# Patient Record
Sex: Female | Born: 1975 | Race: Black or African American | Hispanic: No | Smoking: Current every day smoker
Health system: Southern US, Community
[De-identification: ages and names within clinical notes are randomized; demographics above are authoritative.]

## PROBLEM LIST (undated history)

## (undated) DIAGNOSIS — I10 Essential (primary) hypertension: Secondary | ICD-10-CM

## (undated) DIAGNOSIS — F419 Anxiety disorder, unspecified: Secondary | ICD-10-CM

## (undated) DIAGNOSIS — E119 Type 2 diabetes mellitus without complications: Secondary | ICD-10-CM

## (undated) HISTORY — DX: Anxiety disorder, unspecified: F41.9

## (undated) HISTORY — DX: Essential (primary) hypertension: I10

---

## 2007-02-22 ENCOUNTER — Emergency Department (HOSPITAL_COMMUNITY): Admission: EM | Admit: 2007-02-22 | Discharge: 2007-02-22 | Payer: Self-pay | Admitting: Emergency Medicine

## 2017-05-16 DIAGNOSIS — F419 Anxiety disorder, unspecified: Secondary | ICD-10-CM | POA: Insufficient documentation

## 2017-05-16 DIAGNOSIS — I10 Essential (primary) hypertension: Secondary | ICD-10-CM | POA: Insufficient documentation

## 2017-05-16 DIAGNOSIS — I152 Hypertension secondary to endocrine disorders: Secondary | ICD-10-CM | POA: Insufficient documentation

## 2017-05-21 LAB — HM PAP SMEAR

## 2017-07-01 DIAGNOSIS — E559 Vitamin D deficiency, unspecified: Secondary | ICD-10-CM

## 2017-07-01 HISTORY — DX: Vitamin D deficiency, unspecified: E55.9

## 2019-02-12 ENCOUNTER — Other Ambulatory Visit: Payer: Self-pay

## 2019-02-12 NOTE — Progress Notes (Signed)
This encounter was created in error - please disregard.

## 2019-02-13 ENCOUNTER — Encounter: Payer: Self-pay | Admitting: Family Medicine

## 2019-02-24 ENCOUNTER — Ambulatory Visit: Payer: Managed Care, Other (non HMO) | Admitting: Family Medicine

## 2019-04-15 ENCOUNTER — Other Ambulatory Visit: Payer: Self-pay

## 2019-04-16 ENCOUNTER — Ambulatory Visit (INDEPENDENT_AMBULATORY_CARE_PROVIDER_SITE_OTHER): Payer: Managed Care, Other (non HMO) | Admitting: Family Medicine

## 2019-04-16 ENCOUNTER — Encounter: Payer: Self-pay | Admitting: Family Medicine

## 2019-04-16 VITALS — BP 132/83 | HR 72 | Temp 98.4°F | Ht 66.0 in | Wt 219.0 lb

## 2019-04-16 DIAGNOSIS — F419 Anxiety disorder, unspecified: Secondary | ICD-10-CM | POA: Diagnosis not present

## 2019-04-16 DIAGNOSIS — I10 Essential (primary) hypertension: Secondary | ICD-10-CM

## 2019-04-16 DIAGNOSIS — R7989 Other specified abnormal findings of blood chemistry: Secondary | ICD-10-CM

## 2019-04-16 DIAGNOSIS — N760 Acute vaginitis: Secondary | ICD-10-CM

## 2019-04-16 DIAGNOSIS — N898 Other specified noninflammatory disorders of vagina: Secondary | ICD-10-CM

## 2019-04-16 DIAGNOSIS — B9689 Other specified bacterial agents as the cause of diseases classified elsewhere: Secondary | ICD-10-CM

## 2019-04-16 DIAGNOSIS — Z0000 Encounter for general adult medical examination without abnormal findings: Secondary | ICD-10-CM

## 2019-04-16 DIAGNOSIS — R062 Wheezing: Secondary | ICD-10-CM

## 2019-04-16 DIAGNOSIS — Z130 Encounter for screening for diseases of the blood and blood-forming organs and certain disorders involving the immune mechanism: Secondary | ICD-10-CM

## 2019-04-16 DIAGNOSIS — Z720 Tobacco use: Secondary | ICD-10-CM

## 2019-04-16 DIAGNOSIS — Z23 Encounter for immunization: Secondary | ICD-10-CM | POA: Diagnosis not present

## 2019-04-16 DIAGNOSIS — E559 Vitamin D deficiency, unspecified: Secondary | ICD-10-CM

## 2019-04-16 LAB — WET PREP FOR TRICH, YEAST, CLUE
Clue Cell Exam: POSITIVE — AB
Trichomonas Exam: NEGATIVE
Yeast Exam: NEGATIVE

## 2019-04-16 MED ORDER — ATENOLOL 25 MG PO TABS: 25.0000 mg | ORAL_TABLET | Freq: Every day | ORAL | 2 refills | Status: DC

## 2019-04-16 MED ORDER — ESCITALOPRAM OXALATE 20 MG PO TABS: 20.0000 mg | ORAL_TABLET | Freq: Every day | ORAL | 2 refills | Status: DC

## 2019-04-16 MED ORDER — ALBUTEROL SULFATE HFA 108 (90 BASE) MCG/ACT IN AERS: 2.0000 | INHALATION_SPRAY | Freq: Four times a day (QID) | RESPIRATORY_TRACT | 2 refills | Status: DC | PRN

## 2019-04-16 MED ORDER — METRONIDAZOLE 500 MG PO TABS: 500.0000 mg | ORAL_TABLET | Freq: Two times a day (BID) | ORAL | 0 refills | Status: DC

## 2019-04-16 NOTE — Progress Notes (Signed)
New Patient Office Visit  Assessment & Plan:  1. Well adult exam - Preventive care education provided. Patient declines influenza and HIV screening. UTD with mammogram and pap smear. Mammogram requested from [facility] in [place].  - CBC with Differential/Platelet - CMP14+EGFR - Lipid panel  2. Wheezing - Encouraged smoking cessation. - albuterol (VENTOLIN HFA) 108 (90 Base) MCG/ACT inhaler; Inhale 2 puffs into the lungs every 6 (six) hours as needed for wheezing or shortness of breath.  Dispense: 18 g; Refill: 2  3. Essential (primary) hypertension - Well controlled on current regimen.  - CMP14+EGFR - Lipid panel - atenolol (TENORMIN) 25 MG tablet; Take 1 tablet (25 mg total) by mouth daily.  Dispense: 90 tablet; Refill: 2  4. Anxiety - Well controlled on current regimen.  - escitalopram (LEXAPRO) 20 MG tablet; Take 1 tablet (20 mg total) by mouth daily.  Dispense: 90 tablet; Refill: 2 - CMP14+EGFR  5. Vitamin D deficiency - Not on a supplement.  - VITAMIN D 25 Hydroxy (Vit-D Deficiency, Fractures)  6. Abnormal TSH - Thyroid Panel With TSH  7. Vaginal discharge - WET PREP FOR [place], YEAST, CLUE  8. Vagina itching - WET PREP FOR [place], YEAST, CLUE  9. Tobacco use - Encouraged smoking cessation.  - CBC with Differential/Platelet - Lipid panel  10. Immunization due - Tdap vaccine greater than or equal to 7yo IM  11. Screening for deficiency anemia - CBC with Differential/Platelet   Follow-up: Return in about 1 year (around 04/15/2020) for annual physical.   Hendricks Limes, MSN, APRN, FNP-C [facility]  Subjective:  Patient ID: Rebecca Garner, female    DOB: [birth year]  Age: 44 y.o. MRN: [id number]  Patient Care Team: Loman Brooklyn, FNP as PCP - General (Family Medicine)  CC:  Chief Complaint  Patient presents with  . New Patient (Initial Visit)    HPI Rebecca Garner presents to establish care. She is transferring care from  Dr. Murrell Redden office as he has retired and the office has closed.   Patient reports she wheezes quite often. Denies shortness of breath. She does smoke.  Her other concern is that her eye doctor told her she has cholesterol pockets in her eyes and recommended having cholesterol levels obtained.   She takes Lexapro 20 mg which controls anxiety well.   Atenolol for HTN.   Depression screen [facility] 2/9 04/16/2019  Decreased Interest 0  Down, Depressed, Hopeless 0  PHQ - 2 Score 0  Altered sleeping 1  Tired, decreased energy 0  Change in appetite 0  Feeling bad or failure about yourself  0  Trouble concentrating 0  Moving slowly or fidgety/restless 0  Suicidal thoughts 0  PHQ-9 Score 1  Difficult doing work/chores Not difficult at all   GAD 7 : Generalized Anxiety Score 04/16/2019  Nervous, Anxious, on Edge 0  Control/stop worrying 2  Worry too much - different things 2  Trouble relaxing 0  Restless 0  Easily annoyed or irritable 1  Anxiety Difficulty Somewhat difficult   Patient also c/o vaginal itching and discharge. No concern for STIs.   Review of Systems  Constitutional: Negative for chills, fever, malaise/fatigue and weight loss.  HENT: Negative for congestion, ear discharge, ear pain, nosebleeds, sinus pain, sore throat and tinnitus.   Eyes: Negative for blurred vision, double vision, pain, discharge and redness.  Respiratory: Negative for cough, shortness of breath and wheezing.   Cardiovascular: Negative for chest pain, palpitations and leg swelling.  Gastrointestinal: Negative for abdominal pain, constipation, diarrhea, heartburn, nausea and vomiting.  Genitourinary: Negative for dysuria, frequency and urgency.  Musculoskeletal: Negative for myalgias.  Skin: Negative for rash.  Neurological: Negative for dizziness, seizures, weakness and headaches.  Psychiatric/Behavioral: Negative for depression, substance abuse and suicidal ideas. The patient is not nervous/anxious.      Current Outpatient Medications:  .  atenolol (TENORMIN) 25 MG tablet, Take 1 tablet (25 mg total) by mouth daily., Disp: 90 tablet, Rfl: 2 .  escitalopram (LEXAPRO) 20 MG tablet, Take 1 tablet (20 mg total) by mouth daily., Disp: 90 tablet, Rfl: 2 .  albuterol (VENTOLIN HFA) 108 (90 Base) MCG/ACT inhaler, Inhale 2 puffs into the lungs every 6 (six) hours as needed for wheezing or shortness of breath., Disp: 18 g, Rfl: 2  No Known Allergies  Past Medical History:  Diagnosis Date  . Anxiety   . Hypertension     History reviewed. No pertinent surgical history.  Family History  Problem Relation Age of Onset  . Diabetes Mother   . Hypertension Mother   . Hypertension Sister     Social History   Socioeconomic History  . Marital status: Married    Spouse name: Not on file  . Number of children: Not on file  . Years of education: Not on file  . Highest education level: Not on file  Occupational History  . Not on file  Tobacco Use  . Smoking status: Current Every Day Smoker    Packs/day: 0.50    Years: 15.00    Pack years: 7.50  . Smokeless tobacco: Never Used  Substance and Sexual Activity  . Alcohol use: Never  . Drug use: Never  . Sexual activity: Not Currently  Other Topics Concern  . Not on file  Social History Narrative  . Not on file   Social Determinants of Health   Financial Resource Strain:   . Difficulty of Paying Living Expenses: Not on file  Food Insecurity:   . Worried About Charity fundraiser in the Last Year: Not on file  . Ran Out of Food in the Last Year: Not on file  Transportation Needs:   . Lack of Transportation (Medical): Not on file  . Lack of Transportation (Non-Medical): Not on file  Physical Activity:   . Days of Exercise per Week: Not on file  . Minutes of Exercise per Session: Not on file  Stress:   . Feeling of Stress : Not on file  Social Connections:   . Frequency of Communication with Friends and Family: Not on file  .  Frequency of Social Gatherings with Friends and Family: Not on file  . Attends Religious Services: Not on file  . Active Member of Clubs or Organizations: Not on file  . Attends Archivist Meetings: Not on file  . Marital Status: Not on file  Intimate Partner Violence:   . Fear of Current or Ex-Partner: Not on file  . Emotionally Abused: Not on file  . Physically Abused: Not on file  . Sexually Abused: Not on file    Objective:   Today's Vitals: BP 132/83   Pulse 72   Temp 98.4 F (36.9 C) (Temporal)   Ht 5' 6"  (1.676 m)   Wt 219 lb (99.3 kg)   LMP 03/20/2019 (Exact Date)   SpO2 97%   BMI 35.35 kg/m   Physical Exam Vitals reviewed.  Constitutional:      General: She is not in acute distress.  Appearance: Normal appearance. She is obese. She is not ill-appearing, toxic-appearing or diaphoretic.  HENT:     Head: Normocephalic and atraumatic.     Right Ear: Tympanic membrane, ear canal and external ear normal. There is no impacted cerumen.     Left Ear: Tympanic membrane, ear canal and external ear normal. There is no impacted cerumen.     Nose: Nose normal. No congestion or rhinorrhea.     Mouth/Throat:     Mouth: Mucous membranes are moist.     Pharynx: Oropharynx is clear. No oropharyngeal exudate or posterior oropharyngeal erythema.  Eyes:     General: No scleral icterus.       Right eye: No discharge.        Left eye: No discharge.     Conjunctiva/sclera: Conjunctivae normal.     Pupils: Pupils are equal, round, and reactive to light.  Cardiovascular:     Rate and Rhythm: Normal rate and regular rhythm.     Heart sounds: Normal heart sounds. No murmur. No friction rub. No gallop.   Pulmonary:     Effort: Pulmonary effort is normal. No respiratory distress.     Breath sounds: Normal breath sounds. No stridor. No wheezing, rhonchi or rales.  Abdominal:     General: Abdomen is flat. Bowel sounds are normal. There is no distension.     Palpations: Abdomen  is soft. There is no mass.     Tenderness: There is no abdominal tenderness. There is no guarding or rebound.     Hernia: No hernia is present.  Musculoskeletal:        General: Normal range of motion.     Cervical back: Normal range of motion and neck supple. No rigidity. No muscular tenderness.  Lymphadenopathy:     Cervical: No cervical adenopathy.  Skin:    General: Skin is warm and dry.     Capillary Refill: Capillary refill takes less than 2 seconds.  Neurological:     General: No focal deficit present.     Mental Status: She is alert and oriented to person, place, and time. Mental status is at baseline.  Psychiatric:        Mood and Affect: Mood normal.        Behavior: Behavior normal.        Thought Content: Thought content normal.        Judgment: Judgment normal.

## 2019-04-16 NOTE — Patient Instructions (Signed)

## 2019-04-17 ENCOUNTER — Encounter: Payer: Self-pay | Admitting: Family Medicine

## 2019-04-17 DIAGNOSIS — E1169 Type 2 diabetes mellitus with other specified complication: Secondary | ICD-10-CM | POA: Insufficient documentation

## 2019-04-17 DIAGNOSIS — E782 Mixed hyperlipidemia: Secondary | ICD-10-CM

## 2019-04-17 HISTORY — DX: Mixed hyperlipidemia: E78.2

## 2019-04-17 LAB — CBC WITH DIFFERENTIAL/PLATELET
Basophils Absolute: 0 10*3/uL (ref 0.0–0.2)
Basos: 0 %
EOS (ABSOLUTE): 0.1 10*3/uL (ref 0.0–0.4)
Eos: 1 %
Hematocrit: 43.9 % (ref 34.0–46.6)
Hemoglobin: 15 g/dL (ref 11.1–15.9)
Immature Grans (Abs): 0 10*3/uL (ref 0.0–0.1)
Immature Granulocytes: 0 %
Lymphocytes Absolute: 4.5 10*3/uL — ABNORMAL HIGH (ref 0.7–3.1)
Lymphs: 39 %
MCH: 31.1 pg (ref 26.6–33.0)
MCHC: 34.2 g/dL (ref 31.5–35.7)
MCV: 91 fL (ref 79–97)
Monocytes Absolute: 0.9 10*3/uL (ref 0.1–0.9)
Monocytes: 8 %
Neutrophils Absolute: 6.1 10*3/uL (ref 1.4–7.0)
Neutrophils: 52 %
Platelets: 196 10*3/uL (ref 150–450)
RBC: 4.83 x10E6/uL (ref 3.77–5.28)
RDW: 12.3 % (ref 11.7–15.4)
WBC: 11.5 10*3/uL — ABNORMAL HIGH (ref 3.4–10.8)

## 2019-04-17 LAB — LIPID PANEL
Chol/HDL Ratio: 5.1 ratio — ABNORMAL HIGH (ref 0.0–4.4)
Cholesterol, Total: 209 mg/dL — ABNORMAL HIGH (ref 100–199)
HDL: 41 mg/dL (ref >39)
LDL Chol Calc (NIH): 140 mg/dL — ABNORMAL HIGH (ref 0–99)
Triglycerides: 153 mg/dL — ABNORMAL HIGH (ref 0–149)
VLDL Cholesterol Cal: 28 mg/dL (ref 5–40)

## 2019-04-17 LAB — THYROID PANEL WITH TSH
Free Thyroxine Index: 1.7 (ref 1.2–4.9)
T3 Uptake Ratio: 24 % (ref 24–39)
T4, Total: 7.2 ug/dL (ref 4.5–12.0)
TSH: 0.58 u[IU]/mL (ref 0.450–4.500)

## 2019-04-17 LAB — VITAMIN D 25 HYDROXY (VIT D DEFICIENCY, FRACTURES): Vit D, 25-Hydroxy: 11.3 ng/mL — ABNORMAL LOW (ref 30.0–100.0)

## 2019-04-17 LAB — CMP14+EGFR
ALT: 12 IU/L (ref 0–32)
AST: 12 IU/L (ref 0–40)
Albumin/Globulin Ratio: 1.6 (ref 1.2–2.2)
Albumin: 4.6 g/dL (ref 3.8–4.8)
Alkaline Phosphatase: 84 IU/L (ref 39–117)
BUN/Creatinine Ratio: 16 (ref 9–23)
BUN: 12 mg/dL (ref 6–24)
Bilirubin Total: <0.2 mg/dL (ref 0.0–1.2)
CO2: 24 mmol/L (ref 20–29)
Calcium: 9.3 mg/dL (ref 8.7–10.2)
Chloride: 102 mmol/L (ref 96–106)
Creatinine, Ser: 0.77 mg/dL (ref 0.57–1.00)
GFR calc Af Amer: 108 mL/min/{1.73_m2} (ref >59)
GFR calc non Af Amer: 94 mL/min/{1.73_m2} (ref >59)
Globulin, Total: 2.8 g/dL (ref 1.5–4.5)
Glucose: 86 mg/dL (ref 65–99)
Potassium: 4.1 mmol/L (ref 3.5–5.2)
Sodium: 140 mmol/L (ref 134–144)
Total Protein: 7.4 g/dL (ref 6.0–8.5)

## 2019-04-20 ENCOUNTER — Other Ambulatory Visit: Payer: Self-pay | Admitting: Family Medicine

## 2019-04-20 ENCOUNTER — Encounter: Payer: Self-pay | Admitting: Family Medicine

## 2019-04-20 DIAGNOSIS — E559 Vitamin D deficiency, unspecified: Secondary | ICD-10-CM

## 2019-04-20 MED ORDER — VITAMIN D (ERGOCALCIFEROL) 1.25 MG (50000 UNIT) PO CAPS: 50000.0000 [IU] | ORAL_CAPSULE | ORAL | 2 refills | Status: DC

## 2019-05-11 ENCOUNTER — Encounter: Payer: Self-pay | Admitting: Family Medicine

## 2019-06-28 ENCOUNTER — Encounter: Payer: Self-pay | Admitting: Family Medicine

## 2019-06-29 ENCOUNTER — Encounter: Payer: Self-pay | Admitting: Family Medicine

## 2019-06-29 NOTE — Telephone Encounter (Signed)
Neither of those blood sugar readings is dangerous.  However, since she is feeling badly she should be seen.  Please arrange office visit for her.  Thanks, WS

## 2019-12-17 ENCOUNTER — Encounter: Payer: Self-pay | Admitting: Family Medicine

## 2019-12-17 ENCOUNTER — Ambulatory Visit: Payer: Managed Care, Other (non HMO) | Admitting: Family Medicine

## 2019-12-17 ENCOUNTER — Other Ambulatory Visit: Payer: Self-pay

## 2019-12-17 VITALS — BP 158/91 | HR 88 | Temp 98.0°F | Ht 66.0 in | Wt 220.4 lb

## 2019-12-17 DIAGNOSIS — M25562 Pain in left knee: Secondary | ICD-10-CM

## 2019-12-17 MED ORDER — METHYLPREDNISOLONE ACETATE 80 MG/ML IJ SUSP
80.0000 mg | Freq: Once | INTRAMUSCULAR | Status: AC
  Administered 2019-12-17: 80 mg via INTRAMUSCULAR

## 2019-12-17 MED ORDER — MELOXICAM 7.5 MG PO TABS: 7.5000 mg | ORAL_TABLET | Freq: Every day | ORAL | 0 refills | Status: DC

## 2019-12-17 NOTE — Patient Instructions (Signed)

## 2019-12-17 NOTE — Progress Notes (Signed)
Subjective: CC: left knee pain PCP: Rebecca Fudge, FNP  Rebecca Garner:Rebecca Garner is a 44 y.o. female presenting to clinic today for:  1. Left knee pain Left knee pain 2-3 weeks. Pain is throbbing on the medial side of her knee. Pain waxes and wanes and ranges from 4-8/10. She has tried tylenol and ice with mild improvement. She has tried a knee brace without improvement. She has not tried NSAIDS. The pain is worse at night. She reports that her knee does pop, catch, and feels like it may give out at times. She denies peripheral edema, fever, numbness or tingling in her foot. She denies injury. 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 She was seen at [facility] on 12/[facility] for her knee pain. Xray was normal. She was given prednisone to take, but she stopped taking it as she was not able to sleep. She reports that the pain has gotten worse since then.   Relevant past medical, surgical, family, and social history reviewed and updated as indicated.  Allergies and medications reviewed and updated.  No Known Allergies Past Medical History:  Diagnosis Date  . Anxiety   . Hypertension   . Mixed hyperlipidemia 04/17/2019  . Vitamin D deficiency 07/01/2017    Current Outpatient Medications:  .  albuterol (VENTOLIN HFA) 108 (90 Base) MCG/ACT inhaler, Inhale 2 puffs into the lungs every 6 (six) hours as needed for wheezing or shortness of breath., Disp: 18 g, Rfl: 2 .  atenolol (TENORMIN) 25 MG tablet, Take 1 tablet (25 mg total) by mouth daily., Disp: 90 tablet, Rfl: 2 .  escitalopram (LEXAPRO) 20 MG tablet, Take 1 tablet (20 mg total) by mouth daily., Disp: 90 tablet, Rfl: 2 .  Vitamin D, Ergocalciferol, (DRISDOL) 1.25 MG (50000 UNIT) CAPS capsule, Take 1 capsule (50,000 Units total) by mouth every 7 (seven) days., Disp: 12 capsule, Rfl: 2 Social History   Socioeconomic History  . Marital status: Married    Spouse name: Not on file  . Number of children: Not on file  . Years of education:  Not on file  . Highest education level: Not on file  Occupational History  . Not on file  Tobacco Use  . Smoking status: Current Every Day Smoker    Packs/day: 0.50    Years: 15.00    Pack years: 7.50  . Smokeless tobacco: Never Used  Vaping Use  . Vaping Use: Never used  Substance and Sexual Activity  . Alcohol use: Never  . Drug use: Never  . Sexual activity: Not Currently  Other Topics Concern  . Not on file  Social History Narrative  . Not on file   Social Determinants of Health   Financial Resource Strain:   . Difficulty of Paying Living Expenses: Not on file  Food Insecurity:   . Worried About Programme researcher, broadcasting/film/video in the Last Year: Not on file  . Ran Out of Food in the Last Year: Not on file  Transportation Needs:   . Lack of Transportation (Medical): Not on file  . Lack of Transportation (Non-Medical): Not on file  Physical Activity:   . Days of Exercise per Week: Not on file  . Minutes of Exercise per Session: Not on file  Stress:   . Feeling of Stress : Not on file  Social Connections:   . Frequency of Communication with Friends and Family: Not on file  . Frequency of Social Gatherings with Friends and Family: Not on file  .  Attends Religious Services: Not on file  . Active Member of Clubs or Organizations: Not on file  . Attends Banker Meetings: Not on file  . Marital Status: Not on file  Intimate Partner Violence:   . Fear of Current or Ex-Partner: Not on file  . Emotionally Abused: Not on file  . Physically Abused: Not on file  . Sexually Abused: Not on file   Family History  Problem Relation Age of Onset  . Diabetes Mother   . Hypertension Mother   . Hypertension Sister     Review of Systems  Per HPI.   Objective: Office vital signs reviewed. BP (!) 158/91   Pulse 88   Temp 98 F (36.7 C) (Temporal)   Ht 5\' 6"  (1.676 m)   Wt 220 lb 6 oz (100 kg)   BMI 35.57 kg/m   Physical Examination:  Physical Exam Vitals and nursing  note reviewed.  Constitutional:      Appearance: She is not ill-appearing, toxic-appearing or diaphoretic.  Musculoskeletal:     Left knee: Tenderness present over the medial joint line. Normal patellar mobility.     Right lower leg: No edema.     Left lower leg: No edema.     Comments: Significant tendernesss along medial aspect. Unable to complete further exam due to pain.   Skin:    General: Skin is warm and dry.  Neurological:     General: No focal deficit present.     Mental Status: She is alert and oriented to person, place, and time.  Psychiatric:        Mood and Affect: Mood normal.        Behavior: Behavior normal.      Assessment/ Plan: Rebecca Garner was seen today for knee pain.  Diagnoses and all orders for this visit:  Acute pain of left knee Unable to complete exam due to pain. Recent normal xray since pain began. Concern for ligament or meniscus injury based on history. Referral to ortho. Mobic daily, continue tylenol, heat, brace, ice, rest. Discussed benefits of IM steroid injection since patient did not take PO prednisone. Patient agrees to steroid injection and will try OTC sleep aids to help her sleep for a few days. Return to office for new or worsening symptoms. -     Ambulatory referral to Orthopedic Surgery -     meloxicam (MOBIC) 7.5 MG tablet; Take 1 tablet (7.5 mg total) by mouth daily. -     methylPREDNISolone acetate (DEPO-MEDROL) injection 80 mg  Follow up as needed with PCP.  The above assessment and management plan was discussed with the patient. The patient verbalized understanding of and has agreed to the management plan. Patient is aware to call the clinic if symptoms persist or worsen. Patient is aware when to return to the clinic for a follow-up visit. Patient educated on when it is appropriate to go to the emergency department.   Rebecca Pen, FNP-C [facility] [address] [place], [place] [id number]

## 2019-12-24 ENCOUNTER — Ambulatory Visit (INDEPENDENT_AMBULATORY_CARE_PROVIDER_SITE_OTHER): Payer: Managed Care, Other (non HMO) | Admitting: Orthopaedic Surgery

## 2019-12-24 ENCOUNTER — Other Ambulatory Visit: Payer: Self-pay

## 2019-12-24 ENCOUNTER — Encounter: Payer: Self-pay | Admitting: Orthopaedic Surgery

## 2019-12-24 DIAGNOSIS — M25562 Pain in left knee: Secondary | ICD-10-CM | POA: Insufficient documentation

## 2019-12-24 NOTE — Addendum Note (Signed)
Addended by: Rogers Seeds on: 12/24/2019 03:10 PM   Modules accepted: Orders

## 2019-12-24 NOTE — Progress Notes (Signed)
Office Visit Note   Patient: Rebecca Garner           Date of Birth: 1975/09/13           MRN: [id number] Visit Date: 12/24/2019              Requested by: Gabriel Earing, FNP [address] [place],  [place] [address] PCP: Gwenlyn Fudge, FNP   Assessment & Plan: Visit Diagnoses:  1. Acute pain of left knee     Plan: Patient can continue meloxicam should get a knee sleeve local store.  She can continue some ice in the evening after work.  We will set her up for some physical therapy in [place] where she lives recheck her in 3 weeks if she is having persistent medial pain and catching we may need to consider diagnostic MRI scanning.  Follow-Up Instructions: Return in about 3 weeks (around 01/14/2020).   Orders:  No orders of the defined types were placed in this encounter.  No orders of the defined types were placed in this encounter.     Procedures: No procedures performed   Clinical Data: No additional findings.   Subjective: Chief Complaint  Patient presents with  . 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  She has significant pain primarily medial over the medial joint line near the medial collateral ligament that radiates down toward the pes bursa.  She does not recall any injury no previous surgery she is not had any falls.  She states pain is severe she has trouble sleeping.  Sometimes it feels like it wants to give way but has not locked she took some prednisone it made her have problems sleeping but she did notice some improvement she is taking meloxicam now with some improvement.  She is able to ambulate without cane or crutches.  Plain radiographs 12/08/2019 were negative for acute injury.  No significant degenerative changes noted.  Review of Systems patient does have some mild anxiety hypertension hyperlipidemia vitamin D deficiency all other systems are  negative noncontributory to HPI.   Objective: Vital Signs: Ht 5\' 4"  (1.626 m)   Wt 220 lb (99.8 kg)   BMI 37.76 kg/m   Physical Exam Constitutional:      Appearance: She is well-developed.  HENT:     Head: Normocephalic.     Right Ear: External ear normal.     Left Ear: External ear normal.  Eyes:     Pupils: Pupils are equal, round, and reactive to light.  Neck:     Thyroid: No thyromegaly.     Trachea: No tracheal deviation.  Cardiovascular:     Rate and Rhythm: Normal rate.  Pulmonary:     Effort: Pulmonary effort is normal.  Abdominal:     Palpations: Abdomen is soft.  Skin:    General: Skin is warm and dry.  Neurological:     Mental Status: She is alert and oriented to person, place, and time.  Psychiatric:        Behavior: Behavior normal.     Ortho Exam no sciatic notch tenderness she has tenderness over the joint line medially particularly behind the medial collateral ligament.  Medial collateral takes normal stress ACL PCL Lachman test are all normal.  No pitting edema negative Homan ankle range of motion is normal.  No tenderness over the patellar tendon quad tendon.  No crepitus with knee flexion extension and palpation.  Negative patellar subluxation.  Patient has palpable medial plica but is not terribly tender. Specialty Comments:      No specialty comments available.  Imaging: No results found.          PMFS History: Patient Active Problem List   Diagnosis Date Noted  . Pain in left knee 12/24/2019  . Mixed hyperlipidemia 04/17/2019  . Vitamin D deficiency 07/01/2017  . Anxiety 05/16/2017  . Essential (primary) hypertension 05/16/2017   Past Medical History:  Diagnosis Date  . Anxiety   . Hypertension   . Mixed hyperlipidemia 04/17/2019  . Vitamin D deficiency 07/01/2017    Family History  Problem Relation Age of Onset  . Diabetes Mother   . Hypertension Mother   . Hypertension Sister     No past surgical history on file. Social  History   Occupational History  . Not on file  Tobacco Use  . Smoking status: Current Every Day Smoker    Packs/day: 0.50    Years: 15.00    Pack years: 7.50  . Smokeless tobacco: Never Used  Vaping Use  . Vaping Use: Never used  Substance and Sexual Activity  . Alcohol use: Never  . Drug use: Never  . Sexual activity: Not Currently

## 2019-12-28 ENCOUNTER — Ambulatory Visit: Payer: Managed Care, Other (non HMO) | Admitting: Physical Therapy

## 2019-12-31 ENCOUNTER — Telehealth: Payer: Self-pay | Admitting: Radiology

## 2019-12-31 DIAGNOSIS — M25562 Pain in left knee: Secondary | ICD-10-CM

## 2019-12-31 NOTE — Telephone Encounter (Signed)
Voicemail left for patient to return call to [facility] office and let us know where she would like to have scan performed.

## 2019-12-31 NOTE — Telephone Encounter (Signed)
Patient called and states that her knee is still hurting. She wants to proceed with MRI. Please advise.

## 2019-12-31 NOTE — Telephone Encounter (Signed)
OK thanks. ROV after scan R/O mmtear

## 2020-01-01 ENCOUNTER — Encounter: Payer: Self-pay | Admitting: Physical Therapy

## 2020-01-01 ENCOUNTER — Other Ambulatory Visit: Payer: Self-pay

## 2020-01-01 ENCOUNTER — Ambulatory Visit: Payer: Managed Care, Other (non HMO) | Attending: Orthopaedic Surgery | Admitting: Physical Therapy

## 2020-01-01 DIAGNOSIS — R262 Difficulty in walking, not elsewhere classified: Secondary | ICD-10-CM | POA: Diagnosis present

## 2020-01-01 DIAGNOSIS — M25562 Pain in left knee: Secondary | ICD-10-CM | POA: Insufficient documentation

## 2020-01-01 NOTE — Therapy (Signed)
[facility] [address] [place], [place], [address] Phone: [phone]   Fax:  [phone]  Physical Therapy Evaluation  Patient Details  Name: Rebecca Garner MRN: [id number] Date of Birth: [birth year] Referring Provider (PT): Annell Greening, MD   Encounter Date: 01/01/2020   PT End of Session - 01/01/20 1210    Visit Number 1    Number of Visits 12    Date for PT Re-Evaluation 02/19/20    PT Start Time 0945    PT Stop Time 1028    PT Time Calculation (min) 43 min    Activity Tolerance Patient limited by pain    Behavior During Therapy [facility] for tasks assessed/performed           Past Medical History:  Diagnosis Date  . Anxiety   . Hypertension   . Mixed hyperlipidemia 04/17/2019  . Vitamin D deficiency 07/01/2017    History reviewed. No pertinent surgical history.  There were no vitals filed for this visit.    Subjective Assessment - 01/01/20 1036    Subjective COVID-19 screening performed upon arrival. Patient arrives to phyiscal therapy with an insidious onset of left medial knee pain and tingling to left foot that began about 4 weeks ago. Patient reports pain intensifies at night when she is lying down or when she's walking at work. Patient able to perform all ADLs and home tasks but with pain and increased time to perform. Patient reports pain at worst as 8/10 and pain at best as 5/10. Patient's goals are to decrease pain, sleep longer, improve standing and walking tolerance for home and work activities.    Pertinent History HTN    How long can you stand comfortably? 25 mins    How long can you walk comfortably? 25 mins    Diagnostic tests x-ray: normal results    Patient Stated Goals decrease pain    Currently in Pain? Yes    Pain Score 5     Pain Location Knee    Pain Descriptors / Indicators Sharp    Pain Type Acute pain    Pain Onset More than a month ago    Pain Frequency Constant    Aggravating Factors  laying  sometimes walking    Pain Relieving Factors tylenol, ice    Effect of Pain on Daily Activities "sometimes walking"              [facility] PT Assessment - 01/01/20 0001      Assessment   Medical Diagnosis Acute pain of left knee    Referring Provider (PT) Annell Greening, MD    Onset Date/Surgical Date --   "4 weeks ago" Early October 2021   Next MD Visit "3 weeks" around 01/14/2020    Prior Therapy no      Precautions   Precautions None      Restrictions   Weight Bearing Restrictions No      Balance Screen   Has the patient fallen in the past 6 months No    Has the patient had a decrease in activity level because of a fear of falling?  No    Is the patient reluctant to leave their home because of a fear of falling?  No      Home [employer]ist information centre manager residence      Prior Function   Level of Independence Independent      Sensation   Light Touch Appears Intact      Posture/Postural  Control   Posture Comments standing posture noted with increased R weight bearing with trunk flexed and L knee flexed      ROM / Strength   AROM / PROM / Strength AROM;Strength;PROM      AROM   Overall AROM  Deficits;Due to pain    AROM Assessment Site Knee    Right/Left Knee Left    Left Knee Extension 0    Left Knee Flexion 65      PROM   Overall PROM  Deficits;Due to pain    PROM Assessment Site Knee    Right/Left Knee Left    Left Knee Extension 0    Left Knee Flexion 65      Strength   Overall Strength Deficits    Overall Strength Comments pain    Strength Assessment Site Knee    Right/Left Knee Left    Left Knee Flexion 3-/5    Left Knee Extension 3-/5      Palpation   Palpation comment Very tender to palpation to L knee locally to medial knee and distal quad region. Increased pain and tone to left lateral calf upon palpation      Transfers   Comments slow transtions to sit to stand and for bed mobility      Ambulation/Gait   Gait Pattern Step-through  pattern;Decreased step length - right;Decreased stance time - left;Decreased stride length;Decreased hip/knee flexion - left;Decreased weight shift to left;Left flexed knee in stance;Antalgic;Trunk flexed                      Objective measurements completed on examination: See above findings.       [facility] Adult PT Treatment/Exercise - 01/01/20 0001      Modalities   Modalities Cryotherapy      Cryotherapy   Number Minutes Cryotherapy 10 Minutes    Cryotherapy Location Knee    Type of Cryotherapy Ice pack                  PT Education - 01/01/20 1136    Person(s) Educated Patient    Methods Explanation;Demonstration;Handout    Comprehension Verbalized understanding               PT Long Term Goals - 01/01/20 1248      PT LONG TERM GOAL #1   Title Patient will be independent with HEP    Time 6    Period Weeks    Status New      PT LONG TERM GOAL #2   Title Patient will demonstrate 120+ degrees of left knee flexion AROM to improve functional tasks.    Time 6    Period Weeks    Status New      PT LONG TERM GOAL #3   Title Patient will report ability to perform ADLs, home tasks, and work tasks with L knee pain less than or equal to 4/10.    Time 6    Period Weeks    Status New      PT LONG TERM GOAL #4   Title Patient will demonstrate 4/5 or greater left knee MMT in all planes to improve stability during functional tasks.    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    Time 6    Period Weeks    Status New                  Plan - 01/01/20 1242    Clinical Impression Statement Patient is a 44 year old female who presents to physical therapy with acute right knee pain, decreased L knee ROM, and difficulty walking that began about 4 weeks ago. Patient  very tender to palpation profusely thorughout the knee but more intense at L medial knee joint line. Due to intensity of pain to light touch, PT unable to assess special tests for ligaments. Patient and PT discussed plan of care and HEP to which patient reported understanding. Patient also discussed treatment of deficits and MRI will provide information but will not dictate treatment. Patient reported understanding. Patient would benefit from skilled physical therapy to address deficits and address patient's goals.    Personal Factors and Comorbidities Comorbidity 1    Comorbidities HTN    Examination-Activity Limitations Locomotion Level;Transfers;Sleep;Stand;Squat    Examination-Participation Restrictions Occupation    Stability/Clinical Decision Making Stable/Uncomplicated    Clinical Decision Making Low    Rehab Potential Good    PT Frequency 2x / week    PT Duration 6 weeks    PT Treatment/Interventions ADLs/Self Care Home Management;Cryotherapy;Electrical Stimulation;Iontophoresis 4mg /ml Dexamethasone;Moist Heat;Ultrasound;Gait training;Stair training;Functional mobility training;Therapeutic activities;Therapeutic exercise;Balance training;Neuromuscular re-education;Manual techniques;Passive range of motion;Patient/family education;Taping    PT Next Visit Plan Nustep if able, gentle, pain free ROM and strengthening. modalities as needed for pain relief.    PT Home Exercise Plan see patient education section    Consulted and Agree with Plan of Care Patient           Patient will benefit from skilled therapeutic intervention in order to improve the following deficits and impairments:  Abnormal gait, Decreased activity tolerance, Decreased strength, Decreased range of motion, Difficulty walking, Pain  Visit Diagnosis: Acute pain of left knee - Plan: PT plan of care cert/re-cert  Difficulty in walking, not elsewhere classified - Plan: PT plan of care cert/re-cert     Problem  List Patient Active Problem List   Diagnosis Date Noted  . Pain in left knee 12/24/2019  . Mixed hyperlipidemia 04/17/2019  . Vitamin D deficiency 07/01/2017  . Anxiety 05/16/2017  . Essential (primary) hypertension 05/16/2017    05/18/2017, PT, DPT 01/01/2020, 1:00 PM  [facility] [address] [place], [place], [place] Phone: [phone]   Fax:  [phone]  Name: Flo Berroa MRN: [id number] Date of Birth: 1975/11/08

## 2020-01-04 NOTE — Telephone Encounter (Signed)
Patient called [facility] office. MRI has been scheduled and precerted through them.

## 2020-01-04 NOTE — Addendum Note (Signed)
Addended by: Rogers Seeds on: 01/04/2020 05:08 PM   Modules accepted: Orders

## 2020-01-08 ENCOUNTER — Ambulatory Visit: Payer: Managed Care, Other (non HMO) | Attending: Orthopaedic Surgery | Admitting: Physical Therapy

## 2020-01-11 ENCOUNTER — Encounter: Payer: Managed Care, Other (non HMO) | Admitting: Physical Therapy

## 2020-01-14 ENCOUNTER — Ambulatory Visit: Payer: Managed Care, Other (non HMO) | Admitting: Orthopaedic Surgery

## 2020-01-14 ENCOUNTER — Telehealth: Payer: Self-pay | Admitting: Radiology

## 2020-01-14 ENCOUNTER — Other Ambulatory Visit: Payer: Self-pay

## 2020-01-14 NOTE — Telephone Encounter (Signed)
As I discussed with her needs 6 wks of therapy then can ROV for recheck ucall thanks

## 2020-01-14 NOTE — Telephone Encounter (Signed)
Per Claris Gower in [place] office, patient's MRI was denied. Reasoning was that she has not had six weeks of conservative treatment. PT was ordered and patient has been to one appointment per chart. Do you want patient to continue PT and follow up in a few weeks or would you like to schedule peer to peer? Please advise.

## 2020-01-15 NOTE — Telephone Encounter (Signed)
I have notified Claris Gower in the [place] office.

## 2020-01-18 ENCOUNTER — Telehealth: Payer: Self-pay | Admitting: Family Medicine

## 2020-01-18 NOTE — Telephone Encounter (Signed)
Appointment scheduled.

## 2020-01-18 NOTE — Telephone Encounter (Signed)
Pt was seen in the hospital last Thursday, only stayed for one day and needs an appt made

## 2020-01-19 ENCOUNTER — Encounter: Payer: Self-pay | Admitting: Nurse Practitioner

## 2020-01-19 ENCOUNTER — Ambulatory Visit: Payer: Managed Care, Other (non HMO) | Admitting: Nurse Practitioner

## 2020-01-19 ENCOUNTER — Other Ambulatory Visit: Payer: Self-pay

## 2020-01-19 VITALS — BP 135/90 | HR 67 | Temp 97.9°F | Resp 20 | Ht 64.0 in | Wt 223.0 lb

## 2020-01-19 DIAGNOSIS — M25562 Pain in left knee: Secondary | ICD-10-CM | POA: Diagnosis not present

## 2020-01-19 NOTE — Patient Instructions (Signed)

## 2020-01-19 NOTE — Progress Notes (Signed)
   Subjective:    Patient ID: Rebecca Garner, female    DOB: Aug 07, 1975, 43 y.o.   MRN: [id number]   Chief Complaint: Hospitalization Follow-up   HPI Pt is here today for ER follow-up of left knee pain. She was seen in the office a month ago for the same issue and given mobic and referred to orthopedics. Orthopedic doctor referred her to PT which she says she was unable to do because it was too painful. Left knee has burning sensation. Does report some tingling in the left toes at times. Denies swelling. ER gave her a prescription for pain medication which does help and she is still taking the mobic which is also helping. The ER doctor gave her crutches and told her to keep the leg non-weightbearing. She has a follow-up appointment with orthopedic doctor on Thursday. Pain is sharp and shoots down to her foot. Rates pain at 6/10 currently.   Review of Systems  Constitutional: Negative.   HENT: Negative.   Eyes: Negative.   Respiratory: Negative.   Cardiovascular: Negative.   Gastrointestinal: Negative.   Genitourinary: Negative.   Musculoskeletal: Positive for arthralgias (left knee pain).  Skin: Negative.   Neurological: Negative.   Psychiatric/Behavioral: Negative.        Objective:   Physical Exam Vitals and nursing note reviewed.  Constitutional:      Appearance: Normal appearance.  HENT:     Head: Normocephalic.  Cardiovascular:     Rate and Rhythm: Normal rate and regular rhythm.     Pulses: Normal pulses.     Heart sounds: Normal heart sounds.  Pulmonary:     Effort: Pulmonary effort is normal.  Abdominal:     General: Bowel sounds are normal.     Palpations: Abdomen is soft.  Musculoskeletal:     Cervical back: Normal range of motion.     Right knee: Normal.     Left knee: Tenderness present.     Comments: Elastic brace on left knee. Pain on flexion and extension. Tenderness along medial side of knee on palpaition.  Skin:    General: Skin is warm and dry.    Neurological:     General: No focal deficit present.     Mental Status: She is alert and oriented to person, place, and time.  Psychiatric:        Mood and Affect: Mood normal.        Behavior: Behavior normal.    BP 135/90   Pulse 67   Temp 97.9 F (36.6 C) (Temporal)   Resp 20   Ht 5\' 4"  (1.626 m)   Wt 223 lb (101.2 kg)   SpO2 95%   BMI 38.28 kg/m        Assessment & Plan:  Rebecca Garner in today with chief complaint of Hospitalization Follow-up   1. Acute pain of left knee contiue crutches Continue elastic brace Ice BID Keep appointment with ortho on Thursday Patient refused toradol injection.  Previous visit records and er records reviewed  The above assessment and management plan was discussed with the patient. The patient verbalized understanding of and has agreed to the management plan. Patient is aware to call the clinic if symptoms persist or worsen. Patient is aware when to return to the clinic for a follow-up visit. Patient educated on when it is appropriate to go to the emergency department.   Mary-Margaret Saturday, FNP

## 2020-01-21 ENCOUNTER — Encounter: Payer: Self-pay | Admitting: Orthopaedic Surgery

## 2020-01-21 ENCOUNTER — Other Ambulatory Visit: Payer: Self-pay

## 2020-01-21 ENCOUNTER — Ambulatory Visit (INDEPENDENT_AMBULATORY_CARE_PROVIDER_SITE_OTHER): Payer: Managed Care, Other (non HMO) | Admitting: Orthopaedic Surgery

## 2020-01-21 ENCOUNTER — Ambulatory Visit (INDEPENDENT_AMBULATORY_CARE_PROVIDER_SITE_OTHER): Payer: Managed Care, Other (non HMO)

## 2020-01-21 VITALS — Ht 65.0 in | Wt 225.0 lb

## 2020-01-21 DIAGNOSIS — M25552 Pain in left hip: Secondary | ICD-10-CM | POA: Diagnosis not present

## 2020-01-21 DIAGNOSIS — M25562 Pain in left knee: Secondary | ICD-10-CM

## 2020-01-21 NOTE — Progress Notes (Signed)
Office Visit Note   Patient: Rebecca Garner           Date of Birth: 12/27/1975           MRN: [id number] Visit Date: 01/21/2020              Requested by: Gwenlyn Fudge, FNP [address] [place],  [place] [address] PCP: Gwenlyn Fudge, FNP   Assessment & Plan: Visit Diagnoses: Left knee pain   Plan: Patient rates her pain is severe she is not able to ambulate more than short distance in the exam room without her crutches.  She is now using a knee sleeve.  She has been to her PCP also the emergency room with ongoing problems.  As I have recommended before patient needs an MRI scan of her knee to rule out medial meniscal tear since he is not able to dissipate in normal activities and she is not able to work.  We tried therapy which was unsuccessful she is taken anti-inflammatories.  She is used topical cream, Tylenol 3.  Also follow-up after MRI scan left knee.  Follow-Up Instructions: No follow-ups on file.   Orders:  Orders Placed This Encounter  Procedures  . XR HIP UNILAT W OR W/O PELVIS 2-3 VIEWS LEFT   No orders of the defined types were placed in this encounter.     Procedures: No procedures performed   Clinical Data: No additional findings.   Subjective: Chief Complaint  Patient presents with  . Left Knee - Follow-up, Pain    HPI 44 year old female returns for continued problems with severe right knee pain.  I had sent her to physical therapy and she was seen on 01/01/2020 and states therapy was so painful she was unable to participate and did not go back for the second visit.  She followed up on 01/19/2020 at her PCP and was placed in a knee brace.  She had increased pain difficulty sleeping problems with mobility and was seen in the emergency room at [facility] in [place] and they placed her on crutches.  Patient is taking Tylenol 3 for the pain.  She states that she has not been working pain is been better but she still has great difficulty walking is  barely able to ambulate without the crutches has problems getting out of a car and still having catching in her knee.  We had ordered the MRI scan and it was initially denied since she not had multiple weeks of therapy.  We sent her to therapy and she states the activities of therapy were too painful for her.  Patient states at times with ambulation she has had some pain in her groin.  Review of Systems 14 point systems update unchanged from 12/24/2019 visit.  Positive for hypertension hyperlipidemia vitamin D deficiency as before noted.  All other systems negative other than as mentioned in HPI.   Objective: Vital Signs: Ht 5\' 5"  (1.651 m)   Wt 225 lb (102.1 kg)   BMI 37.44 kg/m   Physical Exam Constitutional:      Appearance: She is well-developed.  HENT:     Head: Normocephalic.     Right Ear: External ear normal.     Left Ear: External ear normal.  Eyes:     Pupils: Pupils are equal, round, and reactive to light.  Neck:     Thyroid: No thyromegaly.     Trachea: No tracheal deviation.  Cardiovascular:     Rate and Rhythm:  Normal rate.  Pulmonary:     Effort: Pulmonary effort is normal.  Abdominal:     Palpations: Abdomen is soft.  Skin:    General: Skin is warm and dry.  Neurological:     Mental Status: She is alert and oriented to person, place, and time.  Psychiatric:        Behavior: Behavior normal.     Ortho Exam patient has 0 to 50 degrees range of motion.  She has extreme pain with palpation medial joint line and reaches and grabs my hand as I palpate the joint line.  Less tenderness laterally.  Some crepitus with patellofemoral pressure and knee extension.  Quad tendon patellar tendon is normal.  Patient has some left groin pain with internal and external rotation left hip logroll.  Negative on the right hip.  Patient has mild effusion left knee no swelling right knee.  Specialty Comments:  No specialty comments available.  Imaging: XR HIP UNILAT W OR W/O PELVIS  2-3 VIEWS LEFT  Result Date: 01/21/2020 AP pelvis AP left hip and frog-leg lateral demonstrates normal hip joint.  Negative for fracture or degenerative changes of the left hip. Impression:: Normal left hip x-rays.    PMFS History: Patient Active Problem List   Diagnosis Date Noted  . Pain in left knee 12/24/2019  . Mixed hyperlipidemia 04/17/2019  . Vitamin D deficiency 07/01/2017  . Anxiety 05/16/2017  . Essential (primary) hypertension 05/16/2017   Past Medical History:  Diagnosis Date  . Anxiety   . Hypertension   . Mixed hyperlipidemia 04/17/2019  . Vitamin D deficiency 07/01/2017    Family History  Problem Relation Age of Onset  . Diabetes Mother   . Hypertension Mother   . Hypertension Sister     No past surgical history on file. Social History   Occupational History  . Not on file  Tobacco Use  . Smoking status: Current Every Day Smoker    Packs/day: 0.50    Years: 15.00    Pack years: 7.50  . Smokeless tobacco: Never Used  Vaping Use  . Vaping Use: Never used  Substance and Sexual Activity  . Alcohol use: Never  . Drug use: Never  . Sexual activity: Not Currently

## 2020-01-25 NOTE — Addendum Note (Signed)
Addended by: Rogers Seeds on: 01/25/2020 10:35 AM   Modules accepted: Orders

## 2020-02-01 ENCOUNTER — Encounter: Payer: Self-pay | Admitting: Nurse Practitioner

## 2020-02-01 ENCOUNTER — Ambulatory Visit (INDEPENDENT_AMBULATORY_CARE_PROVIDER_SITE_OTHER): Payer: Managed Care, Other (non HMO) | Admitting: Nurse Practitioner

## 2020-02-01 ENCOUNTER — Other Ambulatory Visit: Payer: Self-pay

## 2020-02-01 VITALS — BP 137/86 | HR 86 | Temp 97.2°F | Ht 65.0 in | Wt 225.6 lb

## 2020-02-01 DIAGNOSIS — N3281 Overactive bladder: Secondary | ICD-10-CM

## 2020-02-01 DIAGNOSIS — I10 Essential (primary) hypertension: Secondary | ICD-10-CM | POA: Diagnosis not present

## 2020-02-01 DIAGNOSIS — F419 Anxiety disorder, unspecified: Secondary | ICD-10-CM | POA: Diagnosis not present

## 2020-02-01 DIAGNOSIS — E782 Mixed hyperlipidemia: Secondary | ICD-10-CM

## 2020-02-01 LAB — URINALYSIS, MICROSCOPIC ONLY

## 2020-02-01 MED ORDER — HYDROXYZINE HCL 10 MG PO TABS: 10.0000 mg | ORAL_TABLET | Freq: Three times a day (TID) | ORAL | 0 refills | Status: DC | PRN

## 2020-02-01 MED ORDER — TOLTERODINE TARTRATE 2 MG PO TABS: 1.0000 mg | ORAL_TABLET | Freq: Two times a day (BID) | ORAL | 0 refills | Status: DC

## 2020-02-01 NOTE — Assessment & Plan Note (Signed)
Symptoms new for patient in the last few months.  Started patient on Detrol LA milligrams tablet daily.  Provided education to patient with printed handouts given.  Rx sent to pharmacy follow-up in 2-3 weeks.

## 2020-02-01 NOTE — Assessment & Plan Note (Signed)
Well-controlled on atenolol 25 mg tablet daily no changes necessary.  Continue healthy diet and exercise regimen as tolerated.  Rx refill sent to pharmacy. Follow-up in 3 months.

## 2020-02-01 NOTE — Progress Notes (Signed)
Established Patient Office Visit  Subjective:  Patient ID: Rebecca Garner, adult    DOB: 1975/10/06  Age: 44 y.o. MRN: [id number]  CC:  Chief Complaint  Patient presents with  . Medical Management of Chronic Issues    HPI [name] presents for Pt presents for follow up of hypertension. Patient was diagnosed in 05/16/2017 the patient is tolerating the medication well without side effects. Compliance with treatment has been good; including taking medication as directed , maintains a healthy diet and regular exercise regimen , and following up as directed.  Current medication atenolol 25 mg tablet daily.  Mixed hyperlipidemia  Pt presents with hyperlipidemia. Patient was diagnosed in 04/17/2019.  Compliance with treatment has been good.  The patient is compliant with medications, maintains a low cholesterol diet , follows up as directed , and maintains an exercise regimen . The patient denies experiencing any hypercholesterolemia related symptoms.     Anxiety: Patient complains of anxiety disorder.  She has the following symptoms: difficulty concentrating. Onset of symptoms was approximately 3 years ago, gradually worsening since that time. She denies current suicidal and homicidal ideation. Family history significant for no psychiatric illness.Possible organic causes contributing are: none. Risk factors: previous episode of depression Previous treatment includes Lexapro.  She complains of the following side effects from the treatment: none.   Overactive bladder: This is new for patient in the last couple of months.  Patient is reporting that she is unable to make it to the bathroom and having accidents several times a day.  Patient is not reporting any dysuria, pelvic pressure, or signs and symptoms of UTI.    Past Medical History:  Diagnosis Date  . Anxiety   . Hypertension   . Mixed hyperlipidemia 04/17/2019  . Vitamin D deficiency 07/01/2017    History reviewed. No pertinent  surgical history.  Family History  Problem Relation Age of Onset  . Diabetes Mother   . Hypertension Mother   . Hypertension Sister     Social History   Socioeconomic History  . Marital status: Divorced    Spouse name: Not on file  . Number of children: Not on file  . Years of education: Not on file  . Highest education level: Not on file  Occupational History  . Not on file  Tobacco Use  . Smoking status: Current Every Day Smoker    Packs/day: 0.50    Years: 15.00    Pack years: 7.50  . Smokeless tobacco: Never Used  Vaping Use  . Vaping Use: Never used  Substance and Sexual Activity  . Alcohol use: Never  . Drug use: Never  . Sexual activity: Not Currently  Other Topics Concern  . Not on file  Social History Narrative  . Not on file   Social Determinants of Health   Financial Resource Strain:   . Difficulty of Paying Living Expenses: Not on file  Food Insecurity:   . Worried About Programme researcher, broadcasting/film/video in the Last Year: Not on file  . Ran Out of Food in the Last Year: Not on file  Transportation Needs:   . Lack of Transportation (Medical): Not on file  . Lack of Transportation (Non-Medical): Not on file  Physical Activity:   . Days of Exercise per Week: Not on file  . Minutes of Exercise per Session: Not on file  Stress:   . Feeling of Stress : Not on file  Social Connections:   . Frequency of Communication with Friends and  Family: Not on file  . Frequency of Social Gatherings with Friends and Family: Not on file  . Attends Religious Services: Not on file  . Active Member of Clubs or Organizations: Not on file  . Attends Banker Meetings: Not on file  . Marital Status: Not on file  Intimate Partner Violence:   . Fear of Current or Ex-Partner: Not on file  . Emotionally Abused: Not on file  . Physically Abused: Not on file  . Sexually Abused: Not on file    Outpatient Medications Prior to Visit  Medication Sig Dispense Refill  .  acetaminophen-codeine (TYLENOL #3) 300-30 MG tablet Take by mouth.    Marland Kitchen albuterol (VENTOLIN HFA) 108 (90 Base) MCG/ACT inhaler Inhale 2 puffs into the lungs every 6 (six) hours as needed for wheezing or shortness of breath. 18 g 2  . atenolol (TENORMIN) 25 MG tablet Take 1 tablet (25 mg total) by mouth daily. 90 tablet 2  . escitalopram (LEXAPRO) 20 MG tablet Take 1 tablet (20 mg total) by mouth daily. 90 tablet 2  . Vitamin D, Ergocalciferol, (DRISDOL) 1.25 MG (50000 UNIT) CAPS capsule Take 1 capsule (50,000 Units total) by mouth every 7 (seven) days. 12 capsule 2  . meloxicam (MOBIC) 7.5 MG tablet Take 1 tablet (7.5 mg total) by mouth daily. (Patient not taking: Reported on 02/01/2020) 30 tablet 0   No facility-administered medications prior to visit.    No Known Allergies  ROS Review of Systems  Constitutional: Negative for fever.  Genitourinary: Positive for frequency and urgency. Negative for difficulty urinating and dysuria.  Neurological: Negative for dizziness, light-headedness and headaches.  Psychiatric/Behavioral: Negative for self-injury, sleep disturbance and suicidal ideas. The patient is nervous/anxious.   All other systems reviewed and are negative.     Objective:    Physical Exam Vitals reviewed.  Constitutional:      Appearance: Normal appearance. She is obese.  HENT:     Head: Normocephalic.     Mouth/Throat:     Mouth: Mucous membranes are moist.     Pharynx: Oropharynx is clear.  Eyes:     Conjunctiva/sclera: Conjunctivae normal.  Cardiovascular:     Rate and Rhythm: Normal rate and regular rhythm.     Pulses: Normal pulses.     Heart sounds: Normal heart sounds.  Pulmonary:     Effort: Pulmonary effort is normal.     Breath sounds: Normal breath sounds.  Abdominal:     General: Bowel sounds are normal.     Tenderness: There is no right CVA tenderness or left CVA tenderness.  Musculoskeletal:        General: Tenderness present.  Skin:    General:  Skin is warm.  Neurological:     Mental Status: She is alert and oriented to person, place, and time.  Psychiatric:     Comments: Anxiety      BP 137/86   Pulse 86   Temp (!) 97.2 F (36.2 C)   Ht 5\' 5"  (1.651 m)   Wt 225 lb 9.6 oz (102.3 kg)   SpO2 96%   BMI 37.54 kg/m  Wt Readings from Last 3 Encounters:  02/01/20 225 lb 9.6 oz (102.3 kg)  01/21/20 225 lb (102.1 kg)  01/19/20 223 lb (101.2 kg)     Health Maintenance Due  Topic Date Due  . Hepatitis C Screening  Never done  . MAMMOGRAM  Never done    There are no preventive care reminders to display  for this patient.  Lab Results  Component Value Date   TSH 0.580 04/16/2019   Lab Results  Component Value Date   WBC 11.5 (H) 04/16/2019   HGB 15.0 04/16/2019   HCT 43.9 04/16/2019   MCV 91 04/16/2019   PLT 196 04/16/2019   Lab Results  Component Value Date   NA 140 04/16/2019   K 4.1 04/16/2019   CO2 24 04/16/2019   GLUCOSE 86 04/16/2019   BUN 12 04/16/2019   CREATININE 0.77 04/16/2019   BILITOT <0.2 04/16/2019   ALKPHOS 84 04/16/2019   AST 12 04/16/2019   ALT 12 04/16/2019   PROT 7.4 04/16/2019   ALBUMIN 4.6 04/16/2019   CALCIUM 9.3 04/16/2019   Lab Results  Component Value Date   CHOL 209 (H) 04/16/2019   Lab Results  Component Value Date   HDL 41 04/16/2019   Lab Results  Component Value Date   LDLCALC 140 (H) 04/16/2019   Lab Results  Component Value Date   TRIG 153 (H) 04/16/2019   Lab Results  Component Value Date   CHOLHDL 5.1 (H) 04/16/2019      Office Visit from 02/01/2020 in [facility]  PHQ-9 Total Score 6     GAD 7 : Generalized Anxiety Score 02/01/2020 04/16/2019  Nervous, Anxious, on Edge 2 0  Control/stop worrying 0 2  Worry too much - different things 2 2  Trouble relaxing 0 0  Restless 0 0  Easily annoyed or irritable 0 1  Afraid - awful might happen 0 -  Total GAD 7 Score 4 -  Anxiety Difficulty Somewhat difficult Somewhat difficult       Assessment & Plan:  Essential (primary) hypertension Well-controlled on atenolol 25 mg tablet daily no changes necessary.  Continue healthy diet and exercise regimen as tolerated.  Rx refill sent to pharmacy. Follow-up in 3 months.  Mixed hyperlipidemia Well-controlled on current medication regimen no changes necessary.  Provided education to patient with printed handouts given.  Advised patient to continue low-cholesterol diet with exercise regimen. Follow-up in 3 months.  Overactive bladder Symptoms new for patient in the last few months.  Started patient on Detrol LA milligrams tablet daily.  Provided education to patient with printed handouts given.  Rx sent to pharmacy follow-up in 2-3 weeks.  Problem List Items Addressed This Visit      Cardiovascular and Mediastinum   Essential (primary) hypertension    Well-controlled on atenolol 25 mg tablet daily no changes necessary.  Continue healthy diet and exercise regimen as tolerated.  Rx refill sent to pharmacy. Follow-up in 3 months.      Relevant Orders   CBC with Differential   Comprehensive metabolic panel     Genitourinary   Overactive bladder    Symptoms new for patient in the last few months.  Started patient on Detrol LA milligrams tablet daily.  Provided education to patient with printed handouts given.  Rx sent to pharmacy follow-up in 2-3 weeks.      Relevant Medications   tolterodine (DETROL) 2 MG tablet   Other Relevant Orders   Urine Microscopic (Completed)     Other   Anxiety   Relevant Medications   hydrOXYzine (ATARAX/VISTARIL) 10 MG tablet   Mixed hyperlipidemia - Primary    Well-controlled on current medication regimen no changes necessary.  Provided education to patient with printed handouts given.  Advised patient to continue low-cholesterol diet with exercise regimen. Follow-up in 3 months.  Relevant Orders   Lipid Panel      Meds ordered this encounter  Medications  . tolterodine  (DETROL) 2 MG tablet    Sig: Take 0.5 tablets (1 mg total) by mouth 2 (two) times daily.    Dispense:  60 tablet    Refill:  0    Order Specific Question:   Supervising Provider    Answer:   Arville CareETTINGER, JOSHUA A [id number]]  . hydrOXYzine (ATARAX/VISTARIL) 10 MG tablet    Sig: Take 1 tablet (10 mg total) by mouth 3 (three) times daily as needed.    Dispense:  30 tablet    Refill:  0    Order Specific Question:   Supervising Provider    Answer:   Arville CareETTINGER, JOSHUA A [[id number]]    Follow-up: Return in about 3 months (around 05/02/2020).    Daryll Drownnyeje M Aleathea Pugmire, NP

## 2020-02-01 NOTE — Patient Instructions (Signed)
Generalized Anxiety Disorder, Adult Generalized anxiety disorder (GAD) is a mental health disorder. People with this condition constantly worry about everyday events. Unlike normal anxiety, worry related to GAD is not triggered by a specific event. These worries also do not fade or get better with time. GAD interferes with life functions, including relationships, work, and school. GAD can vary from mild to severe. People with severe GAD can have intense waves of anxiety with physical symptoms (panic attacks). What are the causes? The exact cause of GAD is not known. What increases the risk? This condition is more likely to develop in:  Women.  People who have a family history of anxiety disorders.  People who are very shy.  People who experience very stressful life events, such as the death of a loved one.  People who have a very stressful family environment. What are the signs or symptoms? People with GAD often worry excessively about many things in their lives, such as their health and family. They may also be overly concerned about:  Doing well at work.  Being on time.  Natural disasters.  Friendships. Physical symptoms of GAD include:  Fatigue.  Muscle tension or having muscle twitches.  Trembling or feeling shaky.  Being easily startled.  Feeling like your heart is pounding or racing.  Feeling out of breath or like you cannot take a deep breath.  Having trouble falling asleep or staying asleep.  Sweating.  Nausea, diarrhea, or irritable bowel syndrome (IBS).  Headaches.  Trouble concentrating or remembering facts.  Restlessness.  Irritability. How is this diagnosed? Your health care provider can diagnose GAD based on your symptoms and medical history. You will also have a physical exam. The health care provider will ask specific questions about your symptoms, including how severe they are, when they started, and if they come and go. Your health care  provider may ask you about your use of alcohol or drugs, including prescription medicines. Your health care provider may refer you to a mental health specialist for further evaluation. Your health care provider will do a thorough examination and may perform additional tests to rule out other possible causes of your symptoms. To be diagnosed with GAD, a person must have anxiety that:  Is out of his or her control.  Affects several different aspects of his or her life, such as work and relationships.  Causes distress that makes him or her unable to take part in normal activities.  Includes at least three physical symptoms of GAD, such as restlessness, fatigue, trouble concentrating, irritability, muscle tension, or sleep problems. Before your health care provider can confirm a diagnosis of GAD, these symptoms must be present more days than they are not, and they must last for six months or longer. How is this treated? The following therapies are usually used to treat GAD:  Medicine. Antidepressant medicine is usually prescribed for long-term daily control. Antianxiety medicines may be added in severe cases, especially when panic attacks occur.  Talk therapy (psychotherapy). Certain types of talk therapy can be helpful in treating GAD by providing support, education, and guidance. Options include: ? Cognitive behavioral therapy (CBT). People learn coping skills and techniques to ease their anxiety. They learn to identify unrealistic or negative thoughts and behaviors and to replace them with positive ones. ? Acceptance and commitment therapy (ACT). This treatment teaches people how to be mindful as a way to cope with unwanted thoughts and feelings. ? Biofeedback. This process trains you to manage your body's response (  physiological response) through breathing techniques and relaxation methods. You will work with a therapist while machines are used to monitor your physical symptoms.  Stress  management techniques. These include yoga, meditation, and exercise. A mental health specialist can help determine which treatment is best for you. Some people see improvement with one type of therapy. However, other people require a combination of therapies. Follow these instructions at home:  Take over-the-counter and prescription medicines only as told by your health care provider.  Try to maintain a normal routine.  Try to anticipate stressful situations and allow extra time to manage them.  Practice any stress management or self-calming techniques as taught by your health care provider.  Do not punish yourself for setbacks or for not making progress.  Try to recognize your accomplishments, even if they are small.  Keep all follow-up visits as told by your health care provider. This is important. Contact a health care provider if:  Your symptoms do not get better.  Your symptoms get worse.  You have signs of depression, such as: ? A persistently sad, cranky, or irritable mood. ? Loss of enjoyment in activities that used to bring you joy. ? Change in weight or eating. ? Changes in sleeping habits. ? Avoiding friends or family members. ? Loss of energy for normal tasks. ? Feelings of guilt or worthlessness. Get help right away if:  You have serious thoughts about hurting yourself or others. If you ever feel like you may hurt yourself or others, or have thoughts about taking your own life, get help right away. You can go to your nearest emergency department or call:  Your local emergency services (911 in the [place]  A suicide crisis helpline, such as the National Suicide Prevention Lifeline at [phone])218-1381. This is open 24 hours a day. Summary  Generalized anxiety disorder (GAD) is a mental health disorder that involves worry that is not triggered by a specific event.  People with GAD often worry excessively about many things in their lives, such as their health and  family.  GAD may cause physical symptoms such as restlessness, trouble concentrating, sleep problems, frequent sweating, nausea, diarrhea, headaches, and trembling or muscle twitching.  A mental health specialist can help determine which treatment is best for you. Some people see improvement with one type of therapy. However, other people require a combination of therapies. This information is not intended to replace advice given to you by your health care provider. Make sure you discuss any questions you have with your health care provider. Document Revised: 02/01/2017 Document Reviewed: 01/10/2016 Elsevier Patient Education  2020 [employer] High Cholesterol  High cholesterol is a condition in which the blood has high levels of a white, waxy, fat-like substance (cholesterol). The human body needs small amounts of cholesterol. The liver makes all the cholesterol that the body needs. Extra (excess) cholesterol comes from the food that we eat. Cholesterol is carried from the liver by the blood through the blood vessels. If you have high cholesterol, deposits (plaques) may build up on the walls of your blood vessels (arteries). Plaques make the arteries narrower and stiffer. Cholesterol plaques increase your risk for heart attack and stroke. Work with your health care provider to keep your cholesterol levels in a healthy range. What increases the risk? This condition is more likely to develop in people who:  Eat foods that are high in animal fat (saturated fat) or cholesterol.  Are overweight.  Are not getting enough exercise.  Have a  family history of high cholesterol. What are the signs or symptoms? There are no symptoms of this condition. How is this diagnosed? This condition may be diagnosed from the results of a blood test.  If you are older than age 72, your health care provider may check your cholesterol every 4-6 years.  You may be checked more often if you already have high  cholesterol or other risk factors for heart disease. The blood test for cholesterol measures:  "Bad" cholesterol (LDL cholesterol). This is the main type of cholesterol that causes heart disease. The desired level for LDL is less than 100.  "Good" cholesterol (HDL cholesterol). This type helps to protect against heart disease by cleaning the arteries and carrying the LDL away. The desired level for HDL is 60 or higher.  Triglycerides. These are fats that the body can store or burn for energy. The desired number for triglycerides is lower than 150.  Total cholesterol. This is a measure of the total amount of cholesterol in your blood, including LDL cholesterol, HDL cholesterol, and triglycerides. A healthy number is less than 200. How is this treated? This condition is treated with diet changes, lifestyle changes, and medicines. Diet changes  This may include eating more whole grains, fruits, vegetables, nuts, and fish.  This may also include cutting back on red meat and foods that have a lot of added sugar. Lifestyle changes  Changes may include getting at least 40 minutes of aerobic exercise 3 times a week. Aerobic exercises include walking, biking, and swimming. Aerobic exercise along with a healthy diet can help you maintain a healthy weight.  Changes may also include quitting smoking. Medicines  Medicines are usually given if diet and lifestyle changes have failed to reduce your cholesterol to healthy levels.  Your health care provider may prescribe a statin medicine. Statin medicines have been shown to reduce cholesterol, which can reduce the risk of heart disease. Follow these instructions at home: Eating and drinking If told by your health care provider:  Eat chicken (without skin), fish, veal, shellfish, ground [place] breast, and round or loin cuts of red meat.  Do not eat fried foods or fatty meats, such as hot dogs and salami.  Eat plenty of fruits, such as apples.  Eat  plenty of vegetables, such as broccoli, potatoes, and carrots.  Eat beans, peas, and lentils.  Eat grains such as barley, rice, couscous, and bulgur wheat.  Eat pasta without cream sauces.  Use skim or nonfat milk, and eat low-fat or nonfat yogurt and cheeses.  Do not eat or drink whole milk, cream, ice cream, egg yolks, or hard cheeses.  Do not eat stick margarine or tub margarines that contain trans fats (also called partially hydrogenated oils).  Do not eat saturated tropical oils, such as coconut oil and palm oil.  Do not eat cakes, cookies, crackers, or other baked goods that contain trans fats.  General instructions  Exercise as directed by your health care provider. Increase your activity level with activities such as gardening, walking, and taking the stairs.  Take over-the-counter and prescription medicines only as told by your health care provider.  Do not use any products that contain nicotine or tobacco, such as cigarettes and e-cigarettes. If you need help quitting, ask your health care provider.  Keep all follow-up visits as told by your health care provider. This is important. Contact a health care provider if:  You are struggling to maintain a healthy diet or weight.  You need  help to start on an exercise program.  You need help to stop smoking. Get help right away if:  You have chest pain.  You have trouble breathing. This information is not intended to replace advice given to you by your health care provider. Make sure you discuss any questions you have with your health care provider. Document Revised: 02/22/2017 Document Reviewed: 08/20/2015 Elsevier Patient Education  2020 [employer]. Hypertension, Adult Hypertension is another name for high blood pressure. High blood pressure forces your heart to work harder to pump blood. This can cause problems over time. There are two numbers in a blood pressure reading. There is a top number (systolic) over a  bottom number (diastolic). It is best to have a blood pressure that is below 120/80. Healthy choices can help lower your blood pressure, or you may need medicine to help lower it. What are the causes? The cause of this condition is not known. Some conditions may be related to high blood pressure. What increases the risk?  Smoking.  Having type 2 diabetes mellitus, high cholesterol, or both.  Not getting enough exercise or physical activity.  Being overweight.  Having too much fat, sugar, calories, or salt (sodium) in your diet.  Drinking too much alcohol.  Having long-term (chronic) kidney disease.  Having a family history of high blood pressure.  Age. Risk increases with age.  Race. You may be at higher risk if you are African American.  Gender. Men are at higher risk than women before age 57. After age 71, women are at higher risk than men.  Having obstructive sleep apnea.  Stress. What are the signs or symptoms?  High blood pressure may not cause symptoms. Very high blood pressure (hypertensive crisis) may cause: ? Headache. ? Feelings of worry or nervousness (anxiety). ? Shortness of breath. ? Nosebleed. ? A feeling of being sick to your stomach (nausea). ? Throwing up (vomiting). ? Changes in how you see. ? Very bad chest pain. ? Seizures. How is this treated?  This condition is treated by making healthy lifestyle changes, such as: ? Eating healthy foods. ? Exercising more. ? Drinking less alcohol.  Your health care provider may prescribe medicine if lifestyle changes are not enough to get your blood pressure under control, and if: ? Your top number is above 130. ? Your bottom number is above 80.  Your personal target blood pressure may vary. Follow these instructions at home: Eating and drinking   If told, follow the DASH eating plan. To follow this plan: ? Fill one half of your plate at each meal with fruits and vegetables. ? Fill one fourth of your  plate at each meal with whole grains. Whole grains include whole-wheat pasta, brown rice, and whole-grain bread. ? Eat or drink low-fat dairy products, such as skim milk or low-fat yogurt. ? Fill one fourth of your plate at each meal with low-fat (lean) proteins. Low-fat proteins include fish, chicken without skin, eggs, beans, and tofu. ? Avoid fatty meat, cured and processed meat, or chicken with skin. ? Avoid pre-made or processed food.  Eat less than 1,500 mg of salt each day.  Do not drink alcohol if: ? Your doctor tells you not to drink. ? You are pregnant, may be pregnant, or are planning to become pregnant.  If you drink alcohol: ? Limit how much you use to:  0-1 drink a day for women.  0-2 drinks a day for men. ? Be aware of how much alcohol  is in your drink. In the [place] one drink equals one 12 oz bottle of beer (355 mL), one 5 oz glass of wine (148 mL), or one 1 oz glass of hard liquor (44 mL). Lifestyle   Work with your doctor to stay at a healthy weight or to lose weight. Ask your doctor what the best weight is for you.  Get at least 30 minutes of exercise most days of the week. This may include walking, swimming, or biking.  Get at least 30 minutes of exercise that strengthens your muscles (resistance exercise) at least 3 days a week. This may include lifting weights or doing Pilates.  Do not use any products that contain nicotine or tobacco, such as cigarettes, e-cigarettes, and chewing tobacco. If you need help quitting, ask your doctor.  Check your blood pressure at home as told by your doctor.  Keep all follow-up visits as told by your doctor. This is important. Medicines  Take over-the-counter and prescription medicines only as told by your doctor. Follow directions carefully.  Do not skip doses of blood pressure medicine. The medicine does not work as well if you skip doses. Skipping doses also puts you at risk for problems.  Ask your doctor about side  effects or reactions to medicines that you should watch for. Contact a doctor if you:  Think you are having a reaction to the medicine you are taking.  Have headaches that keep coming back (recurring).  Feel dizzy.  Have swelling in your ankles.  Have trouble with your vision. Get help right away if you:  Get a very bad headache.  Start to feel mixed up (confused).  Feel weak or numb.  Feel faint.  Have very bad pain in your: ? Chest. ? Belly (abdomen).  Throw up more than once.  Have trouble breathing. Summary  Hypertension is another name for high blood pressure.  High blood pressure forces your heart to work harder to pump blood.  For most people, a normal blood pressure is less than 120/80.  Making healthy choices can help lower blood pressure. If your blood pressure does not get lower with healthy choices, you may need to take medicine. This information is not intended to replace advice given to you by your health care provider. Make sure you discuss any questions you have with your health care provider. Document Revised: 10/30/2017 Document Reviewed: 10/30/2017 Elsevier Patient Education  2020 [employer].

## 2020-02-01 NOTE — Assessment & Plan Note (Signed)
Well-controlled on current medication regimen no changes necessary.  Provided education to patient with printed handouts given.  Advised patient to continue low-cholesterol diet with exercise regimen. Follow-up in 3 months.

## 2020-02-02 LAB — COMPREHENSIVE METABOLIC PANEL
ALT: 23 IU/L (ref 0–32)
AST: 13 IU/L (ref 0–40)
Albumin/Globulin Ratio: 1.5 (ref 1.2–2.2)
Albumin: 4.2 g/dL (ref 3.8–4.8)
Alkaline Phosphatase: 85 IU/L (ref 44–121)
BUN/Creatinine Ratio: 17 (ref 9–23)
BUN: 14 mg/dL (ref 6–24)
Bilirubin Total: 0.2 mg/dL (ref 0.0–1.2)
CO2: 25 mmol/L (ref 20–29)
Calcium: 9 mg/dL (ref 8.7–10.2)
Chloride: 103 mmol/L (ref 96–106)
Creatinine, Ser: 0.83 mg/dL (ref 0.57–1.00)
GFR calc Af Amer: 98 mL/min/{1.73_m2} (ref >59)
GFR calc non Af Amer: 85 mL/min/{1.73_m2} (ref >59)
Globulin, Total: 2.8 g/dL (ref 1.5–4.5)
Glucose: 133 mg/dL — ABNORMAL HIGH (ref 65–99)
Potassium: 3.9 mmol/L (ref 3.5–5.2)
Sodium: 140 mmol/L (ref 134–144)
Total Protein: 7 g/dL (ref 6.0–8.5)

## 2020-02-02 LAB — CBC WITH DIFFERENTIAL/PLATELET
Basophils Absolute: 0 10*3/uL (ref 0.0–0.2)
Basos: 0 %
EOS (ABSOLUTE): 0.1 10*3/uL (ref 0.0–0.4)
Eos: 1 %
Hematocrit: 42 % (ref 34.0–46.6)
Hemoglobin: 13.9 g/dL (ref 11.1–15.9)
Immature Grans (Abs): 0 10*3/uL (ref 0.0–0.1)
Immature Granulocytes: 0 %
Lymphocytes Absolute: 3.1 10*3/uL (ref 0.7–3.1)
Lymphs: 34 %
MCH: 30.4 pg (ref 26.6–33.0)
MCHC: 33.1 g/dL (ref 31.5–35.7)
MCV: 92 fL (ref 79–97)
Monocytes Absolute: 0.6 10*3/uL (ref 0.1–0.9)
Monocytes: 6 %
Neutrophils Absolute: 5.5 10*3/uL (ref 1.4–7.0)
Neutrophils: 59 %
Platelets: 220 10*3/uL (ref 150–450)
RBC: 4.57 x10E6/uL (ref 3.77–5.28)
RDW: 12.6 % (ref 11.7–15.4)
WBC: 9.3 10*3/uL (ref 3.4–10.8)

## 2020-02-02 LAB — LIPID PANEL
Chol/HDL Ratio: 5.6 ratio — ABNORMAL HIGH (ref 0.0–4.4)
Cholesterol, Total: 223 mg/dL — ABNORMAL HIGH (ref 100–199)
HDL: 40 mg/dL (ref >39)
LDL Chol Calc (NIH): 160 mg/dL — ABNORMAL HIGH (ref 0–99)
Triglycerides: 129 mg/dL (ref 0–149)
VLDL Cholesterol Cal: 23 mg/dL (ref 5–40)

## 2020-02-04 ENCOUNTER — Telehealth: Payer: Self-pay | Admitting: Family Medicine

## 2020-02-04 ENCOUNTER — Other Ambulatory Visit: Payer: Self-pay | Admitting: Nurse Practitioner

## 2020-02-04 MED ORDER — ATORVASTATIN CALCIUM 10 MG PO TABS: 10.0000 mg | ORAL_TABLET | Freq: Every day | ORAL | 0 refills | Status: DC

## 2020-02-04 NOTE — Telephone Encounter (Signed)
Pt was told that she would be prescribed cholesterol medicine but nothing has been called in

## 2020-02-04 NOTE — Telephone Encounter (Signed)
Patient aware that medication was sent today and labs reviewed again with patient.

## 2020-02-09 ENCOUNTER — Telehealth: Payer: Self-pay | Admitting: Radiology

## 2020-02-09 NOTE — Telephone Encounter (Signed)
Patient would like to know if she can return to work on 02/15/2020?  CB [phone]

## 2020-02-09 NOTE — Telephone Encounter (Signed)
Ok to do thanks. 

## 2020-02-10 NOTE — Telephone Encounter (Signed)
I called patient to advise, no answer, no voicemail set up. Note entered into system.

## 2020-02-11 NOTE — Telephone Encounter (Signed)
Patient called [facility] office. Note was faxed for her.

## 2020-03-21 ENCOUNTER — Other Ambulatory Visit: Payer: Managed Care, Other (non HMO)

## 2020-03-23 ENCOUNTER — Other Ambulatory Visit: Payer: Managed Care, Other (non HMO)

## 2020-04-11 ENCOUNTER — Other Ambulatory Visit: Payer: Managed Care, Other (non HMO)

## 2020-04-11 DIAGNOSIS — Z20822 Contact with and (suspected) exposure to covid-19: Secondary | ICD-10-CM

## 2020-04-12 LAB — NOVEL CORONAVIRUS, NAA: SARS-CoV-2, NAA: NOT DETECTED

## 2020-04-12 LAB — SARS-COV-2, NAA 2 DAY TAT

## 2020-04-29 ENCOUNTER — Other Ambulatory Visit: Payer: Self-pay

## 2020-04-29 ENCOUNTER — Ambulatory Visit (INDEPENDENT_AMBULATORY_CARE_PROVIDER_SITE_OTHER): Payer: Managed Care, Other (non HMO) | Admitting: Family Medicine

## 2020-04-29 ENCOUNTER — Encounter: Payer: Self-pay | Admitting: Family Medicine

## 2020-04-29 VITALS — BP 164/99 | HR 92 | Temp 97.9°F | Ht 65.0 in | Wt 226.4 lb

## 2020-04-29 DIAGNOSIS — R062 Wheezing: Secondary | ICD-10-CM

## 2020-04-29 DIAGNOSIS — R7301 Impaired fasting glucose: Secondary | ICD-10-CM

## 2020-04-29 DIAGNOSIS — I10 Essential (primary) hypertension: Secondary | ICD-10-CM

## 2020-04-29 DIAGNOSIS — N3281 Overactive bladder: Secondary | ICD-10-CM | POA: Diagnosis not present

## 2020-04-29 DIAGNOSIS — F419 Anxiety disorder, unspecified: Secondary | ICD-10-CM

## 2020-04-29 DIAGNOSIS — E782 Mixed hyperlipidemia: Secondary | ICD-10-CM | POA: Diagnosis not present

## 2020-04-29 DIAGNOSIS — E559 Vitamin D deficiency, unspecified: Secondary | ICD-10-CM

## 2020-04-29 DIAGNOSIS — M25562 Pain in left knee: Secondary | ICD-10-CM

## 2020-04-29 DIAGNOSIS — J22 Unspecified acute lower respiratory infection: Secondary | ICD-10-CM

## 2020-04-29 LAB — BAYER DCA HB A1C WAIVED: HB A1C (BAYER DCA - WAIVED): 6.2 % (ref <7.0)

## 2020-04-29 MED ORDER — ATENOLOL 50 MG PO TABS: 50.0000 mg | ORAL_TABLET | Freq: Every day | ORAL | 2 refills | Status: DC

## 2020-04-29 MED ORDER — ESCITALOPRAM OXALATE 20 MG PO TABS: 20.0000 mg | ORAL_TABLET | Freq: Every day | ORAL | 1 refills | Status: DC

## 2020-04-29 MED ORDER — AMOXICILLIN-POT CLAVULANATE 875-125 MG PO TABS
1.0000 | ORAL_TABLET | Freq: Two times a day (BID) | ORAL | 0 refills | Status: AC
Start: 2020-04-29 — End: 2020-05-06

## 2020-04-29 MED ORDER — VITAMIN D (ERGOCALCIFEROL) 1.25 MG (50000 UNIT) PO CAPS: 50000.0000 [IU] | ORAL_CAPSULE | ORAL | 2 refills | Status: DC

## 2020-04-29 MED ORDER — HYDROXYZINE HCL 10 MG PO TABS: 10.0000 mg | ORAL_TABLET | Freq: Three times a day (TID) | ORAL | 2 refills | Status: DC | PRN

## 2020-04-29 MED ORDER — MELOXICAM 7.5 MG PO TABS: 7.5000 mg | ORAL_TABLET | Freq: Every day | ORAL | 5 refills | Status: DC

## 2020-04-29 MED ORDER — ALBUTEROL SULFATE HFA 108 (90 BASE) MCG/ACT IN AERS: 2.0000 | INHALATION_SPRAY | Freq: Four times a day (QID) | RESPIRATORY_TRACT | 5 refills | Status: AC | PRN

## 2020-04-29 MED ORDER — ATORVASTATIN CALCIUM 10 MG PO TABS: 10.0000 mg | ORAL_TABLET | Freq: Every day | ORAL | 1 refills | Status: DC

## 2020-04-29 MED ORDER — METHYLPREDNISOLONE 4 MG PO TBPK: ORAL_TABLET | ORAL | 0 refills | Status: DC

## 2020-04-29 MED ORDER — TOLTERODINE TARTRATE 2 MG PO TABS: 1.0000 mg | ORAL_TABLET | Freq: Two times a day (BID) | ORAL | 1 refills | Status: DC

## 2020-04-29 NOTE — Progress Notes (Signed)
Assessment & Plan:  1. Essential (primary) hypertension Uncontrolled.  Atenolol increased from 25 mg to 50 mg once daily.  Education provided on the DASH diet.  Encouraged diet and exercise. - atenolol (TENORMIN) 50 MG tablet; Take 1 tablet (50 mg total) by mouth daily.  Dispense: 30 tablet; Refill: 2 - CMP14+EGFR - Lipid panel  2. Mixed hyperlipidemia Uncontrolled.  Labs to assess today.  Patient was started on atorvastatin 3 months ago. - atorvastatin (LIPITOR) 10 MG tablet; Take 1 tablet (10 mg total) by mouth daily.  Dispense: 90 tablet; Refill: 1 - CMP14+EGFR - Lipid panel  3. Elevated fasting glucose Labs to assess. - Bayer DCA Hb A1c Waived  4. Overactive bladder Well controlled on current regimen.  - tolterodine (DETROL) 2 MG tablet; Take 0.5 tablets (1 mg total) by mouth 2 (two) times daily.  Dispense: 90 tablet; Refill: 1  5. Vitamin D deficiency Labs to assess for improvement. - Vitamin D, Ergocalciferol, (DRISDOL) 1.25 MG (50000 UNIT) CAPS capsule; Take 1 capsule (50,000 Units total) by mouth every 7 (seven) days.  Dispense: 12 capsule; Refill: 2 - VITAMIN D 25 Hydroxy (Vit-D Deficiency, Fractures)  6. Anxiety Well controlled on current regimen.  - hydrOXYzine (ATARAX/VISTARIL) 10 MG tablet; Take 1 tablet (10 mg total) by mouth 3 (three) times daily as needed.  Dispense: 30 tablet; Refill: 2 - escitalopram (LEXAPRO) 20 MG tablet; Take 1 tablet (20 mg total) by mouth daily.  Dispense: 90 tablet; Refill: 1  7. Wheezing - albuterol (VENTOLIN HFA) 108 (90 Base) MCG/ACT inhaler; Inhale 2 puffs into the lungs every 6 (six) hours as needed for wheezing or shortness of breath.  Dispense: 18 g; Refill: 5  8. Lower respiratory infection - amoxicillin-clavulanate (AUGMENTIN) 875-125 MG tablet; Take 1 tablet by mouth 2 (two) times daily for 7 days.  Dispense: 14 tablet; Refill: 0 - methylPREDNISolone (MEDROL DOSEPAK) 4 MG TBPK tablet; Use as directed.  Dispense: 21 each;  Refill: 0   Return in about 6 weeks (around 06/10/2020) for HTN & pap.  Hendricks Limes, MSN, APRN, FNP-C [facility]  Subjective:    Patient ID: Rebecca Garner, adult    DOB: 20-Oct-1975, 45 y.o.   MRN: [id number]  Patient Care Team: Loman Brooklyn, FNP as PCP - General (Family Medicine)   Chief Complaint:  Chief Complaint  Patient presents with  . Hyperlipidemia  . Anxiety       . overactive bladder     Check up of chronic medical conditions   . Cough    Patient states that she has been having cough and congestion x 2 weeks    HPI: Rebecca Garner is a 45 y.o. adult presenting on 04/29/2020 for Hyperlipidemia, Anxiety (/), overactive bladder  (Check up of chronic medical conditions ), and Cough (Patient states that she has been having cough and congestion x 2 weeks)  Hypertension: Patient reports her blood pressure readings are elevated at home as well.  She does eat a low-salt diet.  She is not doing any exercise.  Hyperlipidemia: Patient is taking medication as prescribed.  Overactive bladder: Patient was started on tolterodine which has been effective for her.  Vitamin D deficiency: Patient reports she has been out of her vitamin D supplement for a couple of weeks.  Anxiety: Controlled with Lexapro.  GAD 7 : Generalized Anxiety Score 02/01/2020 04/16/2019  Nervous, Anxious, on Edge 2 0  Control/stop worrying 0 2  Worry too much - different things 2 2  Trouble relaxing 0 0  Restless 0 0  Easily annoyed or irritable 0 1  Afraid - awful might happen 0 -  Total GAD 7 Score 4 -  Anxiety Difficulty Somewhat difficult Somewhat difficult   Depression screen [facility] 2/9 02/01/2020 02/01/2020 01/19/2020  Decreased Interest 1 0 0  Down, Depressed, Hopeless 1 0 0  PHQ - 2 Score 2 0 0  Altered sleeping 1 - -  Tired, decreased energy 1 - -  Change in appetite 1 - -  Feeling bad or failure about yourself  - - -  Trouble concentrating - - -  Moving slowly or  fidgety/restless 1 - -  Suicidal thoughts - - -  PHQ-9 Score 6 - -  Difficult doing work/chores Somewhat difficult - -    New complaints: Patient complains of cough, chest congestion, wheezing and nausea. Onset of symptoms was 2 weeks ago, unchanged since that time. She is drinking plenty of fluids. Evaluation to date: at home COVID-19 test negative.. Treatment to date: none. She does smoke. Patient has not been fully vaccinated against COVID-19, but has received the first two doses.   Social history:  Relevant past medical, surgical, family and social history reviewed and updated as indicated. Interim medical history since our last visit reviewed.  Allergies and medications reviewed and updated.  DATA REVIEWED: CHART IN EPIC  ROS: Negative unless specifically indicated above in HPI.    Current Outpatient Medications:  .  acetaminophen-codeine (TYLENOL #3) 300-30 MG tablet, Take by mouth., Disp: , Rfl:  .  albuterol (VENTOLIN HFA) 108 (90 Base) MCG/ACT inhaler, Inhale 2 puffs into the lungs every 6 (six) hours as needed for wheezing or shortness of breath., Disp: 18 g, Rfl: 2 .  atenolol (TENORMIN) 25 MG tablet, Take 1 tablet (25 mg total) by mouth daily., Disp: 90 tablet, Rfl: 2 .  atorvastatin (LIPITOR) 10 MG tablet, Take 1 tablet (10 mg total) by mouth daily., Disp: 90 tablet, Rfl: 0 .  escitalopram (LEXAPRO) 20 MG tablet, Take 1 tablet (20 mg total) by mouth daily., Disp: 90 tablet, Rfl: 2 .  hydrOXYzine (ATARAX/VISTARIL) 10 MG tablet, Take 1 tablet (10 mg total) by mouth 3 (three) times daily as needed., Disp: 30 tablet, Rfl: 0 .  meloxicam (MOBIC) 7.5 MG tablet, Take 1 tablet (7.5 mg total) by mouth daily., Disp: 30 tablet, Rfl: 0 .  tolterodine (DETROL) 2 MG tablet, Take 0.5 tablets (1 mg total) by mouth 2 (two) times daily., Disp: 60 tablet, Rfl: 0 .  Vitamin D, Ergocalciferol, (DRISDOL) 1.25 MG (50000 UNIT) CAPS capsule, Take 1 capsule (50,000 Units total) by mouth every 7  (seven) days., Disp: 12 capsule, Rfl: 2   No Known Allergies Past Medical History:  Diagnosis Date  . Anxiety   . Hypertension   . Mixed hyperlipidemia 04/17/2019  . Vitamin D deficiency 07/01/2017    History reviewed. No pertinent surgical history.  Social History   Socioeconomic History  . Marital status: Divorced    Spouse name: Not on file  . Number of children: Not on file  . Years of education: Not on file  . Highest education level: Not on file  Occupational History  . Not on file  Tobacco Use  . Smoking status: Current Every Day Smoker    Packs/day: 0.50    Years: 15.00    Pack years: 7.50  . Smokeless tobacco: Never Used  Vaping Use  . Vaping Use: Never used  Substance and Sexual Activity  .  Alcohol use: Never  . Drug use: Never  . Sexual activity: Not Currently  Other Topics Concern  . Not on file  Social History Narrative  . Not on file   Social Determinants of Health   Financial Resource Strain: Not on file  Food Insecurity: Not on file  Transportation Needs: Not on file  Physical Activity: Not on file  Stress: Not on file  Social Connections: Not on file  Intimate Partner Violence: Not on file        Objective:    BP (!) 164/99   Pulse 92   Temp 97.9 F (36.6 C) (Temporal)   Ht 5' 5"  (1.651 m)   Wt 226 lb 6.4 oz (102.7 kg)   SpO2 92%   BMI 37.67 kg/m   Wt Readings from Last 3 Encounters:  04/29/20 226 lb 6.4 oz (102.7 kg)  02/01/20 225 lb 9.6 oz (102.3 kg)  01/21/20 225 lb (102.1 kg)    Physical Exam Vitals reviewed.  Constitutional:      General: She is not in acute distress.    Appearance: Normal appearance. She is obese. She is not ill-appearing, toxic-appearing or diaphoretic.  HENT:     Head: Normocephalic and atraumatic.  Eyes:     General: No scleral icterus.       Right eye: No discharge.        Left eye: No discharge.     Conjunctiva/sclera: Conjunctivae normal.     Comments: Xanthelasmas present bilaterally.   Cardiovascular:     Rate and Rhythm: Normal rate and regular rhythm.     Heart sounds: Normal heart sounds. No murmur heard. No friction rub. No gallop.   Pulmonary:     Effort: Pulmonary effort is normal. No respiratory distress.     Breath sounds: No stridor. Examination of the right-upper field reveals rhonchi. Examination of the left-upper field reveals rhonchi. Examination of the right-middle field reveals rhonchi. Examination of the left-middle field reveals rhonchi. Examination of the right-lower field reveals rhonchi. Examination of the left-lower field reveals rhonchi. Rhonchi present. No wheezing or rales.  Musculoskeletal:        General: Normal range of motion.     Cervical back: Normal range of motion.  Skin:    General: Skin is warm and dry.  Neurological:     Mental Status: She is alert and oriented to person, place, and time. Mental status is at baseline.  Psychiatric:        Mood and Affect: Mood normal.        Behavior: Behavior normal.        Thought Content: Thought content normal.        Judgment: Judgment normal.     Lab Results  Component Value Date   TSH 0.580 04/16/2019   Lab Results  Component Value Date   WBC 9.3 02/01/2020   HGB 13.9 02/01/2020   HCT 42.0 02/01/2020   MCV 92 02/01/2020   PLT 220 02/01/2020   Lab Results  Component Value Date   NA 140 02/01/2020   K 3.9 02/01/2020   CO2 25 02/01/2020   GLUCOSE 133 (H) 02/01/2020   BUN 14 02/01/2020   CREATININE 0.83 02/01/2020   BILITOT 0.2 02/01/2020   ALKPHOS 85 02/01/2020   AST 13 02/01/2020   ALT 23 02/01/2020   PROT 7.0 02/01/2020   ALBUMIN 4.2 02/01/2020   CALCIUM 9.0 02/01/2020   Lab Results  Component Value Date   CHOL 223 (H) 02/01/2020  Lab Results  Component Value Date   HDL 40 02/01/2020   Lab Results  Component Value Date   LDLCALC 160 (H) 02/01/2020   Lab Results  Component Value Date   TRIG 129 02/01/2020   Lab Results  Component Value Date   CHOLHDL 5.6  (H) 02/01/2020   No results found for: HGBA1C

## 2020-04-29 NOTE — Patient Instructions (Signed)
DASH Eating Plan DASH stands for Dietary Approaches to Stop Hypertension. The DASH eating plan is a healthy eating plan that has been shown to:  Reduce high blood pressure (hypertension).  Reduce your risk for type 2 diabetes, heart disease, and stroke.  Help with weight loss. What are tips for following this plan? Reading food labels  Check food labels for the amount of salt (sodium) per serving. Choose foods with less than 5 percent of the Daily Value of sodium. Generally, foods with less than 300 milligrams (mg) of sodium per serving fit into this eating plan.  To find whole grains, look for the word "whole" as the first word in the ingredient list. Shopping  Buy products labeled as "low-sodium" or "no salt added."  Buy fresh foods. Avoid canned foods and pre-made or frozen meals. Cooking  Avoid adding salt when cooking. Use salt-free seasonings or herbs instead of table salt or sea salt. Check with your health care provider or pharmacist before using salt substitutes.  Do not fry foods. Cook foods using healthy methods such as baking, boiling, grilling, roasting, and broiling instead.  Cook with heart-healthy oils, such as olive, canola, avocado, soybean, or sunflower oil. Meal planning  Eat a balanced diet that includes: ? 4 or more servings of fruits and 4 or more servings of vegetables each day. Try to fill one-half of your plate with fruits and vegetables. ? 6-8 servings of whole grains each day. ? Less than 6 oz (170 g) of lean meat, poultry, or fish each day. A 3-oz (85-g) serving of meat is about the same size as a deck of cards. One egg equals 1 oz (28 g). ? 2-3 servings of low-fat dairy each day. One serving is 1 cup (237 mL). ? 1 serving of nuts, seeds, or beans 5 times each week. ? 2-3 servings of heart-healthy fats. Healthy fats called omega-3 fatty acids are found in foods such as walnuts, flaxseeds, fortified milks, and eggs. These fats are also found in  cold-water fish, such as sardines, salmon, and mackerel.  Limit how much you eat of: ? Canned or prepackaged foods. ? Food that is high in trans fat, such as some fried foods. ? Food that is high in saturated fat, such as fatty meat. ? Desserts and other sweets, sugary drinks, and other foods with added sugar. ? Full-fat dairy products.  Do not salt foods before eating.  Do not eat more than 4 egg yolks a week.  Try to eat at least 2 vegetarian meals a week.  Eat more home-cooked food and less restaurant, buffet, and fast food.   Lifestyle  When eating at a restaurant, ask that your food be prepared with less salt or no salt, if possible.  If you drink alcohol: ? Limit how much you use to:  0-1 drink a day for women who are not pregnant.  0-2 drinks a day for men. ? Be aware of how much alcohol is in your drink. In the [place] one drink equals one 12 oz bottle of beer (355 mL), one 5 oz glass of wine (148 mL), or one 1 oz glass of hard liquor (44 mL). General information  Avoid eating more than 2,300 mg of salt a day. If you have hypertension, you may need to reduce your sodium intake to 1,500 mg a day.  Work with your health care provider to maintain a healthy body weight or to lose weight. Ask what an ideal weight is for you.    Get at least 30 minutes of exercise that causes your heart to beat faster (aerobic exercise) most days of the week. Activities may include walking, swimming, or biking.  Work with your health care provider or dietitian to adjust your eating plan to your individual calorie needs. What foods should I eat? Fruits All fresh, dried, or frozen fruit. Canned fruit in natural juice (without added sugar). Vegetables Fresh or frozen vegetables (raw, steamed, roasted, or grilled). Low-sodium or reduced-sodium tomato and vegetable juice. Low-sodium or reduced-sodium tomato sauce and tomato paste. Low-sodium or reduced-sodium canned vegetables. Grains Whole-grain  or whole-wheat bread. Whole-grain or whole-wheat pasta. Brown rice. Oatmeal. Quinoa. Bulgur. Whole-grain and low-sodium cereals. Pita bread. Low-fat, low-sodium crackers. Whole-wheat flour tortillas. Meats and other proteins Skinless chicken or turkey. Ground chicken or turkey. Pork with fat trimmed off. Fish and seafood. Egg whites. Dried beans, peas, or lentils. Unsalted nuts, nut butters, and seeds. Unsalted canned beans. Lean cuts of beef with fat trimmed off. Low-sodium, lean precooked or cured meat, such as sausages or meat loaves. Dairy Low-fat (1%) or fat-free (skim) milk. Reduced-fat, low-fat, or fat-free cheeses. Nonfat, low-sodium ricotta or cottage cheese. Low-fat or nonfat yogurt. Low-fat, low-sodium cheese. Fats and oils Soft margarine without trans fats. Vegetable oil. Reduced-fat, low-fat, or light mayonnaise and salad dressings (reduced-sodium). Canola, safflower, olive, avocado, soybean, and sunflower oils. Avocado. Seasonings and condiments Herbs. Spices. Seasoning mixes without salt. Other foods Unsalted popcorn and pretzels. Fat-free sweets. The items listed above may not be a complete list of foods and beverages you can eat. Contact a dietitian for more information. What foods should I avoid? Fruits Canned fruit in a light or heavy syrup. Fried fruit. Fruit in cream or butter sauce. Vegetables Creamed or fried vegetables. Vegetables in a cheese sauce. Regular canned vegetables (not low-sodium or reduced-sodium). Regular canned tomato sauce and paste (not low-sodium or reduced-sodium). Regular tomato and vegetable juice (not low-sodium or reduced-sodium). Pickles. Olives. Grains Baked goods made with fat, such as croissants, muffins, or some breads. Dry pasta or rice meal packs. Meats and other proteins Fatty cuts of meat. Ribs. Fried meat. Bacon. Bologna, salami, and other precooked or cured meats, such as sausages or meat loaves. Fat from the back of a pig (fatback).  Bratwurst. Salted nuts and seeds. Canned beans with added salt. Canned or smoked fish. Whole eggs or egg yolks. Chicken or turkey with skin. Dairy Whole or 2% milk, cream, and half-and-half. Whole or full-fat cream cheese. Whole-fat or sweetened yogurt. Full-fat cheese. Nondairy creamers. Whipped toppings. Processed cheese and cheese spreads. Fats and oils Butter. Stick margarine. Lard. Shortening. Ghee. Bacon fat. Tropical oils, such as coconut, palm kernel, or palm oil. Seasonings and condiments Onion salt, garlic salt, seasoned salt, table salt, and sea salt. Worcestershire sauce. Tartar sauce. Barbecue sauce. Teriyaki sauce. Soy sauce, including reduced-sodium. Steak sauce. Canned and packaged gravies. Fish sauce. Oyster sauce. Cocktail sauce. Store-bought horseradish. Ketchup. Mustard. Meat flavorings and tenderizers. Bouillon cubes. Hot sauces. Pre-made or packaged marinades. Pre-made or packaged taco seasonings. Relishes. Regular salad dressings. Other foods Salted popcorn and pretzels. The items listed above may not be a complete list of foods and beverages you should avoid. Contact a dietitian for more information. Where to find more information  National Heart, Lung, and Blood Institute: www.nhlbi.nih.gov  American Heart Association: www.heart.org  Academy of Nutrition and Dietetics: www.eatright.org  National Kidney Foundation: www.kidney.org Summary  The DASH eating plan is a healthy eating plan that has been shown to reduce high   blood pressure (hypertension). It may also reduce your risk for type 2 diabetes, heart disease, and stroke.  When on the DASH eating plan, aim to eat more fresh fruits and vegetables, whole grains, lean proteins, low-fat dairy, and heart-healthy fats.  With the DASH eating plan, you should limit salt (sodium) intake to 2,300 mg a day. If you have hypertension, you may need to reduce your sodium intake to 1,500 mg a day.  Work with your health care  provider or dietitian to adjust your eating plan to your individual calorie needs. This information is not intended to replace advice given to you by your health care provider. Make sure you discuss any questions you have with your health care provider. Document Revised: 01/23/2019 Document Reviewed: 01/23/2019 Elsevier Patient Education  2021 [employer].   

## 2020-04-30 LAB — CMP14+EGFR
ALT: 15 IU/L (ref 0–32)
AST: 14 IU/L (ref 0–40)
Albumin/Globulin Ratio: 1.3 (ref 1.2–2.2)
Albumin: 4 g/dL (ref 3.8–4.8)
Alkaline Phosphatase: 81 IU/L (ref 44–121)
BUN/Creatinine Ratio: 15 (ref 9–23)
BUN: 12 mg/dL (ref 6–24)
Bilirubin Total: 0.2 mg/dL (ref 0.0–1.2)
CO2: 25 mmol/L (ref 20–29)
Calcium: 9.4 mg/dL (ref 8.7–10.2)
Chloride: 104 mmol/L (ref 96–106)
Creatinine, Ser: 0.79 mg/dL (ref 0.57–1.00)
GFR calc Af Amer: 104 mL/min/{1.73_m2} (ref >59)
GFR calc non Af Amer: 90 mL/min/{1.73_m2} (ref >59)
Globulin, Total: 3 g/dL (ref 1.5–4.5)
Glucose: 115 mg/dL — ABNORMAL HIGH (ref 65–99)
Potassium: 4.2 mmol/L (ref 3.5–5.2)
Sodium: 141 mmol/L (ref 134–144)
Total Protein: 7 g/dL (ref 6.0–8.5)

## 2020-04-30 LAB — VITAMIN D 25 HYDROXY (VIT D DEFICIENCY, FRACTURES): Vit D, 25-Hydroxy: 19.5 ng/mL — ABNORMAL LOW (ref 30.0–100.0)

## 2020-04-30 LAB — LIPID PANEL
Chol/HDL Ratio: 5.3 ratio — ABNORMAL HIGH (ref 0.0–4.4)
Cholesterol, Total: 201 mg/dL — ABNORMAL HIGH (ref 100–199)
HDL: 38 mg/dL — ABNORMAL LOW (ref >39)
LDL Chol Calc (NIH): 144 mg/dL — ABNORMAL HIGH (ref 0–99)
Triglycerides: 103 mg/dL (ref 0–149)
VLDL Cholesterol Cal: 19 mg/dL (ref 5–40)

## 2020-05-02 ENCOUNTER — Encounter: Payer: Self-pay | Admitting: Family Medicine

## 2020-05-02 ENCOUNTER — Other Ambulatory Visit: Payer: Self-pay | Admitting: Family Medicine

## 2020-05-02 DIAGNOSIS — R7303 Prediabetes: Secondary | ICD-10-CM

## 2020-05-02 DIAGNOSIS — E782 Mixed hyperlipidemia: Secondary | ICD-10-CM

## 2020-05-02 HISTORY — DX: Prediabetes: R73.03

## 2020-05-02 MED ORDER — ATORVASTATIN CALCIUM 20 MG PO TABS
20.0000 mg | ORAL_TABLET | Freq: Every day | ORAL | 2 refills | Status: DC
Start: 2020-05-02 — End: 2020-07-19

## 2020-05-05 ENCOUNTER — Telehealth: Payer: Self-pay

## 2020-05-05 NOTE — Telephone Encounter (Signed)
Re eval scheduled in covid clinic slot for tomorrow

## 2020-05-05 NOTE — Telephone Encounter (Signed)
Pt called stating that she was seen recently for an upper respiratory infection and says she is not getting any better. Believes she could have pneumonia and wants provider to put in order for her to come in and have xray to check lungs.  Please advise and call patient ASAP.

## 2020-05-06 ENCOUNTER — Encounter: Payer: Self-pay | Admitting: Family Medicine

## 2020-05-06 ENCOUNTER — Ambulatory Visit (INDEPENDENT_AMBULATORY_CARE_PROVIDER_SITE_OTHER): Payer: Managed Care, Other (non HMO)

## 2020-05-06 ENCOUNTER — Ambulatory Visit (INDEPENDENT_AMBULATORY_CARE_PROVIDER_SITE_OTHER): Payer: Managed Care, Other (non HMO) | Admitting: Family Medicine

## 2020-05-06 VITALS — BP 150/85 | HR 80 | Temp 97.8°F

## 2020-05-06 DIAGNOSIS — R059 Cough, unspecified: Secondary | ICD-10-CM

## 2020-05-06 DIAGNOSIS — J22 Unspecified acute lower respiratory infection: Secondary | ICD-10-CM | POA: Diagnosis not present

## 2020-05-06 DIAGNOSIS — R0602 Shortness of breath: Secondary | ICD-10-CM

## 2020-05-06 LAB — VERITOR FLU A/B WAIVED
Influenza A: NEGATIVE
Influenza B: NEGATIVE

## 2020-05-06 MED ORDER — AZITHROMYCIN 250 MG PO TABS: ORAL_TABLET | ORAL | 0 refills | Status: DC

## 2020-05-06 MED ORDER — BENZONATATE 100 MG PO CAPS: 100.0000 mg | ORAL_CAPSULE | Freq: Three times a day (TID) | ORAL | 0 refills | Status: DC | PRN

## 2020-05-06 NOTE — Progress Notes (Signed)
Acute Office Visit  Subjective:    Patient ID: Rebecca Garner, adult    DOB: [birth year], 45 y.o.   MRN: [id number]  Chief Complaint  Patient presents with  . Shortness of Breath    cough    HPI Patient is in today for cough. Rebecca Garner reports a cough and congestion for 3 weeks. She was seen by her PCP 1 week ago and diagnosed with a lower respiratory infection. She was given Augment x 7 days and a steroid dosepak. She was also given an albuterol inhaler. She reports shortness of breath occurs with rest and with exertion at time. She has been using the albuterol inhaler every 6 hours with a little improvement. She reports feels sputum come up when she coughs but she cannot expel it. She can hear rattling when she is breathing. Coughing clears the rattling for a short period. She has started sitting up at night so that she can breath a little better. She has had to do that for the last 2 night. She denies fever, chills, body aches, dizziness, nausea, vomiting, edema, or chest pain. She feels fatigued. She has also been taking mucinex without improvement.   Past Medical History:  Diagnosis Date  . Anxiety   . Hypertension   . Mixed hyperlipidemia 04/17/2019  . Prediabetes 05/02/2020  . Vitamin D deficiency 07/01/2017    No past surgical history on file.  Family History  Problem Relation Age of Onset  . Diabetes Mother   . Hypertension Mother   . Hypertension Sister     Social History   Socioeconomic History  . Marital status: Divorced    Spouse name: Not on file  . Number of children: Not on file  . Years of education: Not on file  . Highest education level: Not on file  Occupational History  . Not on file  Tobacco Use  . Smoking status: Current Every Day Smoker    Packs/day: 0.50    Years: 15.00    Pack years: 7.50  . Smokeless tobacco: Never Used  Vaping Use  . Vaping Use: Never used  Substance and Sexual Activity  . Alcohol use: Never  . Drug use: Never  . Sexual  activity: Not Currently  Other Topics Concern  . Not on file  Social History Narrative  . Not on file   Social Determinants of Health   Financial Resource Strain: Not on file  Food Insecurity: Not on file  Transportation Needs: Not on file  Physical Activity: Not on file  Stress: Not on file  Social Connections: Not on file  Intimate Partner Violence: Not on file    Outpatient Medications Prior to Visit  Medication Sig Dispense Refill  . acetaminophen-codeine (TYLENOL #3) 300-30 MG tablet Take by mouth.    Marland Kitchen albuterol (VENTOLIN HFA) 108 (90 Base) MCG/ACT inhaler Inhale 2 puffs into the lungs every 6 (six) hours as needed for wheezing or shortness of breath. 18 g 5  . amoxicillin-clavulanate (AUGMENTIN) 875-125 MG tablet Take 1 tablet by mouth 2 (two) times daily for 7 days. 14 tablet 0  . atenolol (TENORMIN) 50 MG tablet Take 1 tablet (50 mg total) by mouth daily. 30 tablet 2  . atorvastatin (LIPITOR) 20 MG tablet Take 1 tablet (20 mg total) by mouth daily. 30 tablet 2  . escitalopram (LEXAPRO) 20 MG tablet Take 1 tablet (20 mg total) by mouth daily. 90 tablet 1  . hydrOXYzine (ATARAX/VISTARIL) 10 MG tablet Take 1 tablet (10 mg total)  by mouth 3 (three) times daily as needed. 30 tablet 2  . meloxicam (MOBIC) 7.5 MG tablet Take 1 tablet (7.5 mg total) by mouth daily. 30 tablet 5  . tolterodine (DETROL) 2 MG tablet Take 0.5 tablets (1 mg total) by mouth 2 (two) times daily. 90 tablet 1  . Vitamin D, Ergocalciferol, (DRISDOL) 1.25 MG (50000 UNIT) CAPS capsule Take 1 capsule (50,000 Units total) by mouth every 7 (seven) days. 12 capsule 2  . methylPREDNISolone (MEDROL DOSEPAK) 4 MG TBPK tablet Use as directed. 21 each 0   No facility-administered medications prior to visit.    No Known Allergies  Review of Systems As per HPI.     Objective:    Physical Exam Vitals and nursing note reviewed.  Constitutional:      General: She is not in acute distress.    Appearance: She is  well-developed. She is ill-appearing. She is not toxic-appearing or diaphoretic.  Eyes:     Extraocular Movements: Extraocular movements intact.     Pupils: Pupils are equal, round, and reactive to light.  Neck:     Vascular: No JVD.  Cardiovascular:     Rate and Rhythm: Normal rate and regular rhythm.  No extrasystoles are present.    Heart sounds: No murmur heard.   Pulmonary:     Effort: Pulmonary effort is normal. No tachypnea or accessory muscle usage.     Breath sounds: Examination of the right-upper field reveals rhonchi. Examination of the left-upper field reveals rhonchi. Examination of the left-middle field reveals rhonchi. Examination of the right-lower field reveals rhonchi. Examination of the left-lower field reveals rhonchi. Rhonchi present. No wheezing.  Chest:     Chest wall: No mass, deformity, tenderness or edema.  Abdominal:     Palpations: Abdomen is soft.  Musculoskeletal:     Cervical back: Neck supple.     Right lower leg: No edema.     Left lower leg: No edema.  Lymphadenopathy:     Cervical: No cervical adenopathy.  Skin:    General: Skin is dry.  Neurological:     General: No focal deficit present.     Mental Status: She is alert and oriented to person, place, and time.  Psychiatric:        Mood and Affect: Mood normal.        Behavior: Behavior normal.     BP (!) 150/85   Pulse 80   Temp 97.8 F (36.6 C) (Temporal)   SpO2 97% Comment: room air Wt Readings from Last 3 Encounters:  04/29/20 226 lb 6.4 oz (102.7 kg)  02/01/20 225 lb 9.6 oz (102.3 kg)  01/21/20 225 lb (102.1 kg)    Health Maintenance Due  Topic Date Due  . MAMMOGRAM  Never done  . PAP SMEAR-Modifier  05/21/2020    There are no preventive care reminders to display for this patient.   Lab Results  Component Value Date   TSH 0.580 04/16/2019   Lab Results  Component Value Date   WBC 9.3 02/01/2020   HGB 13.9 02/01/2020   HCT 42.0 02/01/2020   MCV 92 02/01/2020   PLT  220 02/01/2020   Lab Results  Component Value Date   NA 141 04/29/2020   K 4.2 04/29/2020   CO2 25 04/29/2020   GLUCOSE 115 (H) 04/29/2020   BUN 12 04/29/2020   CREATININE 0.79 04/29/2020   BILITOT 0.2 04/29/2020   ALKPHOS 81 04/29/2020   AST 14 04/29/2020   ALT  15 04/29/2020   PROT 7.0 04/29/2020   ALBUMIN 4.0 04/29/2020   CALCIUM 9.4 04/29/2020   Lab Results  Component Value Date   CHOL 201 (H) 04/29/2020   Lab Results  Component Value Date   HDL 38 (L) 04/29/2020   Lab Results  Component Value Date   LDLCALC 144 (H) 04/29/2020   Lab Results  Component Value Date   TRIG 103 04/29/2020   Lab Results  Component Value Date   CHOLHDL 5.3 (H) 04/29/2020   Lab Results  Component Value Date   HGBA1C 6.2 04/29/2020       Assessment & Plan:   Rebecca Garner was seen today for shortness of breath.  Diagnoses and all orders for this visit:  Cough Covid test pending, quarantine pending results. Rapid flu negative. CXR normal today in office. Tessalon perles for cough. Mucinex DM for congestion.  -     Veritor Flu A/B Waived -     Novel Coronavirus, NAA ([facility]) -     DG Chest 2 View -     benzonatate (TESSALON PERLES) 100 MG capsule; Take 1 capsule (100 mg total) by mouth 3 (three) times daily as needed for cough.  Shortness of breath Albuterol as needed. CXR normal today in office. Pulse ox 97% today.  -     DG Chest 2 View  Lower respiratory infection Rhonchi in all lung fields today. Mucinex for congestion. CXR was normal today. Patient has completed augmentin. Will treat with Zpak for additional coverage.  -     azithromycin (ZITHROMAX Z-PAK) 250 MG tablet; As directed   Return to office for new or worsening symptoms, or if symptoms persist. Follow up in office on Monday if no improvement.    The patient indicates understanding of these issues and agrees with the plan.   Gabriel Earing, FNP

## 2020-05-07 LAB — SARS-COV-2, NAA 2 DAY TAT

## 2020-05-07 LAB — NOVEL CORONAVIRUS, NAA: SARS-CoV-2, NAA: NOT DETECTED

## 2020-05-10 ENCOUNTER — Telehealth: Payer: Self-pay

## 2020-05-10 NOTE — Telephone Encounter (Signed)
Pt says that albuterol (VENTOLIN HFA) 108 (90 Base) MCG/ACT inhaler is not working. She is still sob and wheezing at night. She is asking for something else to be called in. Use [facility] pharmacy

## 2020-05-10 NOTE — Telephone Encounter (Signed)
No answer, no voicemail.

## 2020-07-04 ENCOUNTER — Encounter: Payer: Self-pay | Admitting: Family Medicine

## 2020-07-15 ENCOUNTER — Ambulatory Visit: Payer: Managed Care, Other (non HMO) | Admitting: Family Medicine

## 2020-07-19 ENCOUNTER — Ambulatory Visit (INDEPENDENT_AMBULATORY_CARE_PROVIDER_SITE_OTHER): Payer: Managed Care, Other (non HMO) | Admitting: Family Medicine

## 2020-07-19 ENCOUNTER — Other Ambulatory Visit: Payer: Self-pay

## 2020-07-19 ENCOUNTER — Encounter: Payer: Self-pay | Admitting: Family Medicine

## 2020-07-19 VITALS — BP 151/95 | HR 72 | Temp 98.0°F | Resp 20 | Ht 65.0 in | Wt 228.0 lb

## 2020-07-19 DIAGNOSIS — F419 Anxiety disorder, unspecified: Secondary | ICD-10-CM

## 2020-07-19 DIAGNOSIS — I10 Essential (primary) hypertension: Secondary | ICD-10-CM

## 2020-07-19 DIAGNOSIS — E8881 Metabolic syndrome: Secondary | ICD-10-CM

## 2020-07-19 DIAGNOSIS — R7303 Prediabetes: Secondary | ICD-10-CM | POA: Diagnosis not present

## 2020-07-19 DIAGNOSIS — E782 Mixed hyperlipidemia: Secondary | ICD-10-CM

## 2020-07-19 DIAGNOSIS — G4719 Other hypersomnia: Secondary | ICD-10-CM

## 2020-07-19 DIAGNOSIS — R0683 Snoring: Secondary | ICD-10-CM

## 2020-07-19 MED ORDER — OZEMPIC (0.25 OR 0.5 MG/DOSE) 2 MG/1.5ML ~~LOC~~ SOPN: 0.2500 mg | PEN_INJECTOR | SUBCUTANEOUS | 0 refills | Status: DC

## 2020-07-19 MED ORDER — AMLODIPINE BESYLATE 5 MG PO TABS: 5.0000 mg | ORAL_TABLET | Freq: Every day | ORAL | 2 refills | Status: DC

## 2020-07-19 MED ORDER — ATENOLOL 50 MG PO TABS: 50.0000 mg | ORAL_TABLET | Freq: Every day | ORAL | 1 refills | Status: DC

## 2020-07-19 MED ORDER — HYDROXYZINE HCL 10 MG PO TABS: 10.0000 mg | ORAL_TABLET | Freq: Three times a day (TID) | ORAL | 1 refills | Status: DC | PRN

## 2020-07-19 MED ORDER — ATORVASTATIN CALCIUM 20 MG PO TABS
20.0000 mg | ORAL_TABLET | Freq: Every day | ORAL | 1 refills | Status: DC
Start: 2020-07-19 — End: 2020-09-06

## 2020-07-19 NOTE — Progress Notes (Signed)
Assessment & Plan:  1. Essential (primary) hypertension Uncontrolled.  Continue atenolol 50 mg once daily.  Start amlodipine 5 mg once daily.  Continue diet and exercise. - atenolol (TENORMIN) 50 MG tablet; Take 1 tablet (50 mg total) by mouth daily.  Dispense: 90 tablet; Refill: 1 - amLODipine (NORVASC) 5 MG tablet; Take 1 tablet (5 mg total) by mouth daily.  Dispense: 30 tablet; Refill: 2  2. Prediabetes Continue diet and exercise.  3. Mixed hyperlipidemia Previously uncontrolled.  Atorvastatin increased from 10 mg to 20 mg less than 3 months ago.  Continue current dosing, will recheck labs at next visit. - atorvastatin (LIPITOR) 20 MG tablet; Take 1 tablet (20 mg total) by mouth daily.  Dispense: 90 tablet; Refill: 1  4. Metabolic syndrome Rx'd Ozempic 0.25 mg once weekly.  Continue diet and exercise.  5. Morbid obesity (HCC) Rx'd Ozempic 0.25 mg once weekly.  Continue diet and exercise.  6-7. Excessive daytime sleepiness/Snoring Referring to pulmonology for possible sleep study.   Return in about 6 weeks (around 08/30/2020) for HTN, prediabetes, Ozempic.  Deliah Boston, MSN, APRN, FNP-C [facility]  Subjective:    Patient ID: Rebecca Garner, adult    DOB: 1975/11/29, 45 y.o.   MRN: [id number]  Patient Care Team: Gwenlyn Fudge, FNP as PCP - General (Family Medicine)   Chief Complaint:  Chief Complaint  Patient presents with  . Medical Management of Chronic Issues    HPI: Rebecca Garner is a 45 y.o. adult presenting on 07/19/2020 for Medical Management of Chronic Issues  Hypertension: Patient reports her blood pressure readings are elevated at home as well.  She does eat a low-salt diet.  She is not doing any exercise.  She does sometimes have blurry vision and pains in the back of her head.  Denies dizziness.  Prediabetes: Patient reports her blood sugar is up to 300 during random times of the day.  She is eating healthy and walking for  exercise.  New complaints: Patient reports she has been feeling fatigued and sleepy a lot.  She does snore loudly and has noticed that she sometimes wakes up with a gasp for breath.  Height: 5\' 5"  (1.651 m)  Weight: 228 lbs BMI: Body mass index is 37.94 kg/m.  1. Do you snore loudly (louder than talking or loud enough to be heard through closed doors)?  Yes 2. Do you often feel tired, fatigued, or sleepy during daytime?  Yes 3. Has anyone observed you stop breathing during your sleep?  Yes 4. Do you have or are you being treated for high blood pressure?  Yes 5. BMI more than 35 kg/m2?  Yes 6. Age over 50 years?  No 7. Neck circumference greater than 40 cm?   8. Female gender?  No  Total "Yes" Answers: 5   Social history:  Relevant past medical, surgical, family and social history reviewed and updated as indicated. Interim medical history since our last visit reviewed.  Allergies and medications reviewed and updated.  DATA REVIEWED: CHART IN EPIC  ROS: Negative unless specifically indicated above in HPI.    Current Outpatient Medications:  .  albuterol (VENTOLIN HFA) 108 (90 Base) MCG/ACT inhaler, Inhale 2 puffs into the lungs every 6 (six) hours as needed for wheezing or shortness of breath., Disp: 18 g, Rfl: 5 .  atenolol (TENORMIN) 50 MG tablet, Take 1 tablet (50 mg total) by mouth daily., Disp: 30 tablet, Rfl: 2 .  atorvastatin (LIPITOR) 20 MG tablet,  Take 1 tablet (20 mg total) by mouth daily., Disp: 30 tablet, Rfl: 2 .  escitalopram (LEXAPRO) 20 MG tablet, Take 1 tablet (20 mg total) by mouth daily., Disp: 90 tablet, Rfl: 1 .  hydrOXYzine (ATARAX/VISTARIL) 10 MG tablet, Take 1 tablet (10 mg total) by mouth 3 (three) times daily as needed., Disp: 30 tablet, Rfl: 2 .  meloxicam (MOBIC) 7.5 MG tablet, Take 1 tablet (7.5 mg total) by mouth daily., Disp: 30 tablet, Rfl: 5 .  tolterodine (DETROL) 2 MG tablet, Take 0.5 tablets (1 mg total) by mouth 2 (two) times daily.,  Disp: 90 tablet, Rfl: 1 .  Vitamin D, Ergocalciferol, (DRISDOL) 1.25 MG (50000 UNIT) CAPS capsule, Take 1 capsule (50,000 Units total) by mouth every 7 (seven) days., Disp: 12 capsule, Rfl: 2   No Known Allergies Past Medical History:  Diagnosis Date  . Anxiety   . Hypertension   . Mixed hyperlipidemia 04/17/2019  . Prediabetes 05/02/2020  . Vitamin D deficiency 07/01/2017    History reviewed. No pertinent surgical history.  Social History   Socioeconomic History  . Marital status: Divorced    Spouse name: Not on file  . Number of children: Not on file  . Years of education: Not on file  . Highest education level: Not on file  Occupational History  . Not on file  Tobacco Use  . Smoking status: Current Every Day Smoker    Packs/day: 0.50    Years: 15.00    Pack years: 7.50  . Smokeless tobacco: Never Used  Vaping Use  . Vaping Use: Never used  Substance and Sexual Activity  . Alcohol use: Never  . Drug use: Never  . Sexual activity: Not Currently  Other Topics Concern  . Not on file  Social History Narrative  . Not on file   Social Determinants of Health   Financial Resource Strain: Not on file  Food Insecurity: Not on file  Transportation Needs: Not on file  Physical Activity: Not on file  Stress: Not on file  Social Connections: Not on file  Intimate Partner Violence: Not on file        Objective:    BP (!) 151/95   Pulse 72   Temp 98 F (36.7 C)   Resp 20   Ht 5\' 5"  (1.651 m)   Wt 228 lb (103.4 kg)   LMP 06/19/2020   SpO2 95%   BMI 37.94 kg/m   Wt Readings from Last 3 Encounters:  07/19/20 228 lb (103.4 kg)  04/29/20 226 lb 6.4 oz (102.7 kg)  02/01/20 225 lb 9.6 oz (102.3 kg)    Physical Exam Vitals reviewed.  Constitutional:      General: She is not in acute distress.    Appearance: Normal appearance. She is morbidly obese. She is not ill-appearing, toxic-appearing or diaphoretic.  HENT:     Head: Normocephalic and atraumatic.  Eyes:      General: No scleral icterus.       Right eye: No discharge.        Left eye: No discharge.     Conjunctiva/sclera: Conjunctivae normal.     Comments: Xanthelasmas present bilaterally.  Cardiovascular:     Rate and Rhythm: Normal rate and regular rhythm.     Heart sounds: Normal heart sounds. No murmur heard. No friction rub. No gallop.   Pulmonary:     Effort: Pulmonary effort is normal. No respiratory distress.     Breath sounds: Normal breath sounds. No stridor.  No wheezing, rhonchi or rales.  Musculoskeletal:        General: Normal range of motion.     Cervical back: Normal range of motion.  Skin:    General: Skin is warm and dry.  Neurological:     Mental Status: She is alert and oriented to person, place, and time. Mental status is at baseline.  Psychiatric:        Mood and Affect: Mood normal.        Behavior: Behavior normal.        Thought Content: Thought content normal.        Judgment: Judgment normal.     Lab Results  Component Value Date   TSH 0.580 04/16/2019   Lab Results  Component Value Date   WBC 9.3 02/01/2020   HGB 13.9 02/01/2020   HCT 42.0 02/01/2020   MCV 92 02/01/2020   PLT 220 02/01/2020   Lab Results  Component Value Date   NA 141 04/29/2020   K 4.2 04/29/2020   CO2 25 04/29/2020   GLUCOSE 115 (H) 04/29/2020   BUN 12 04/29/2020   CREATININE 0.79 04/29/2020   BILITOT 0.2 04/29/2020   ALKPHOS 81 04/29/2020   AST 14 04/29/2020   ALT 15 04/29/2020   PROT 7.0 04/29/2020   ALBUMIN 4.0 04/29/2020   CALCIUM 9.4 04/29/2020   Lab Results  Component Value Date   CHOL 201 (H) 04/29/2020   Lab Results  Component Value Date   HDL 38 (L) 04/29/2020   Lab Results  Component Value Date   LDLCALC 144 (H) 04/29/2020   Lab Results  Component Value Date   TRIG 103 04/29/2020   Lab Results  Component Value Date   CHOLHDL 5.3 (H) 04/29/2020   Lab Results  Component Value Date   HGBA1C 6.2 04/29/2020

## 2020-07-20 ENCOUNTER — Telehealth: Payer: Self-pay | Admitting: *Deleted

## 2020-07-20 DIAGNOSIS — R7303 Prediabetes: Secondary | ICD-10-CM

## 2020-07-20 DIAGNOSIS — R0683 Snoring: Secondary | ICD-10-CM

## 2020-07-20 NOTE — Telephone Encounter (Signed)
Rebecca Garner (Key: [id number]) Rx #: [id number] Ozempic (0.25 or 0.5 MG/DOSE) 2MG /1.5ML pen-injectors PA appt  Sent to plan

## 2020-07-21 NOTE — Telephone Encounter (Signed)
Please make patient aware

## 2020-07-21 NOTE — Telephone Encounter (Signed)
Patient calling back to see if there is something else she can use in place of the Ozempic. She can't afford that. Talked to [place] today and her sugar is elevated now. Please call.

## 2020-07-21 NOTE — Telephone Encounter (Signed)
Patient aware we are waiting back from provider. States her BS was 54 but it is going down.  Patient is going to keep a check on her BS.

## 2020-07-21 NOTE — Telephone Encounter (Signed)
Patient aware and verbalizes understanding.  Would like to know if there is something else she can do to help with weight loss?

## 2020-07-21 NOTE — Telephone Encounter (Signed)
PA denied for ozempic will not pay for weight loss treatment must have type 2 diabetes.

## 2020-07-22 MED ORDER — SAXENDA 18 MG/3ML ~~LOC~~ SOPN: 0.6000 mg | PEN_INJECTOR | Freq: Every day | SUBCUTANEOUS | 2 refills | Status: DC

## 2020-07-22 MED ORDER — METFORMIN HCL ER 500 MG PO TB24: 500.0000 mg | ORAL_TABLET | Freq: Every day | ORAL | 2 refills | Status: DC

## 2020-07-22 NOTE — Telephone Encounter (Signed)
Referral placed.

## 2020-07-22 NOTE — Telephone Encounter (Signed)
Patient states that she snores loud and sometimes has trouble breathing at while she is trying to sleep.

## 2020-07-22 NOTE — Telephone Encounter (Signed)
Patient aware and verbalizes understanding.  States that she wanted to know if you were still going to order a sleep study to see if she has sleep apnea ? There is no referral placed.

## 2020-07-22 NOTE — Telephone Encounter (Signed)
Can she remind me of her symptoms? I don't seem to have this documented.

## 2020-07-22 NOTE — Telephone Encounter (Signed)
Patient aware.

## 2020-07-22 NOTE — Telephone Encounter (Signed)
Attempted to contact patient - NVM 

## 2020-07-22 NOTE — Telephone Encounter (Signed)
Please let patient know I put her on Metformin 500 mg once daily for her elevated blood glucose readings.   We can try to get Saxenda approved for weight loss.

## 2020-07-25 ENCOUNTER — Encounter: Payer: Self-pay | Admitting: Family Medicine

## 2020-07-29 ENCOUNTER — Telehealth: Payer: Self-pay | Admitting: Family Medicine

## 2020-08-30 ENCOUNTER — Ambulatory Visit (INDEPENDENT_AMBULATORY_CARE_PROVIDER_SITE_OTHER): Payer: Managed Care, Other (non HMO) | Admitting: Family Medicine

## 2020-08-30 ENCOUNTER — Other Ambulatory Visit: Payer: Self-pay | Admitting: Family Medicine

## 2020-08-30 DIAGNOSIS — R7303 Prediabetes: Secondary | ICD-10-CM

## 2020-08-30 DIAGNOSIS — I10 Essential (primary) hypertension: Secondary | ICD-10-CM

## 2020-08-30 DIAGNOSIS — E782 Mixed hyperlipidemia: Secondary | ICD-10-CM

## 2020-08-30 DIAGNOSIS — E559 Vitamin D deficiency, unspecified: Secondary | ICD-10-CM | POA: Diagnosis not present

## 2020-08-30 MED ORDER — SAXENDA 18 MG/3ML ~~LOC~~ SOPN: 0.6000 mg | PEN_INJECTOR | Freq: Every day | SUBCUTANEOUS | 2 refills | Status: DC

## 2020-08-30 NOTE — Telephone Encounter (Signed)
  Prescription Request  08/30/2020  What is the name of the medication or equipment? Weight Loss Meds?  Have you contacted your pharmacy to request a refill? (if applicable) yes  Which pharmacy would you like this sent to? [facility]   Patient notified that their request is being sent to the clinical staff for review and that they should receive a response within 2 business days.    Rebecca Garner's pt.  She just did televisit with [name] today bc she is remote.

## 2020-08-30 NOTE — Progress Notes (Signed)
Virtual Visit via Telephone Note  I connected with Rebecca Garner on 08/30/20 at 1:42 PM by telephone and verified that I am speaking with the correct person using two identifiers. Rebecca Garner is currently located at [employer] and her sister and her son are currently with her during this visit. The provider, Loman Brooklyn, FNP is located in their home at time of visit.  I discussed the limitations, risks, security and privacy concerns of performing an evaluation and management service by telephone and the availability of in person appointments. I also discussed with the patient that there may be a patient responsible charge related to this service. The patient expressed understanding and agreed to proceed.  Subjective: PCP: Loman Brooklyn, FNP  Chief Complaint  Patient presents with   Hypertension   Prediabetes   Obesity   Hypertension: Patient is checking her blood pressure at home with a wrist cuff.  She reports her systolic is staying in the 120s.    Prediabetes: Patient is taking metformin 500 mg once daily.  She reports it makes her feel "crazy" sometimes.  When trying to understand further about how it makes her feel she stated she just "feels weird".  Obesity: We were unable to get Ozempic approved through her insurance.  Kirke Shaggy was sent to the pharmacy, however patient never received it.   ROS: Per HPI  Current Outpatient Medications:    albuterol (VENTOLIN HFA) 108 (90 Base) MCG/ACT inhaler, Inhale 2 puffs into the lungs every 6 (six) hours as needed for wheezing or shortness of breath., Disp: 18 g, Rfl: 5   amLODipine (NORVASC) 5 MG tablet, Take 1 tablet (5 mg total) by mouth daily., Disp: 30 tablet, Rfl: 2   atenolol (TENORMIN) 50 MG tablet, Take 1 tablet (50 mg total) by mouth daily., Disp: 90 tablet, Rfl: 1   atorvastatin (LIPITOR) 20 MG tablet, Take 1 tablet (20 mg total) by mouth daily., Disp: 90 tablet, Rfl: 1   escitalopram (LEXAPRO) 20 MG tablet, Take 1 tablet  (20 mg total) by mouth daily., Disp: 90 tablet, Rfl: 1   hydrOXYzine (ATARAX/VISTARIL) 10 MG tablet, Take 1 tablet (10 mg total) by mouth 3 (three) times daily as needed., Disp: 90 tablet, Rfl: 1   Liraglutide -Weight Management (SAXENDA) 18 MG/3ML SOPN, Inject 0.6 mg into the skin daily., Disp: 3 mL, Rfl: 2   meloxicam (MOBIC) 7.5 MG tablet, Take 1 tablet (7.5 mg total) by mouth daily., Disp: 30 tablet, Rfl: 5   metFORMIN (GLUCOPHAGE-XR) 500 MG 24 hr tablet, Take 1 tablet (500 mg total) by mouth daily with breakfast., Disp: 30 tablet, Rfl: 2   tolterodine (DETROL) 2 MG tablet, Take 0.5 tablets (1 mg total) by mouth 2 (two) times daily., Disp: 90 tablet, Rfl: 1   Vitamin D, Ergocalciferol, (DRISDOL) 1.25 MG (50000 UNIT) CAPS capsule, Take 1 capsule (50,000 Units total) by mouth every 7 (seven) days., Disp: 12 capsule, Rfl: 2  No Known Allergies Past Medical History:  Diagnosis Date   Anxiety    Hypertension    Mixed hyperlipidemia 04/17/2019   Prediabetes 05/02/2020   Vitamin D deficiency 07/01/2017    Observations/Objective: A&O  No respiratory distress or wheezing audible over the phone Mood, judgement, and thought processes all WNL   Assessment and Plan: 1. Essential (primary) hypertension Well controlled on current regimen.  - CBC with Differential/Platelet; Future - CMP14+EGFR; Future - Lipid panel; Future  2. Prediabetes Labs to assess. - Bayer DCA Hb A1c Waived; Future  3. Morbid obesity ([place]) Encouraged patient to follow-up with the pharmacy about her Saxenda prescription.  4. Vitamin D deficiency Labs to assess. - VITAMIN D 25 Hydroxy (Vit-D Deficiency, Fractures); Future  5. Mixed hyperlipidemia Labs to assess. - CMP14+EGFR; Future - Lipid panel; Future   Follow Up Instructions: Return in about 3 months (around 11/30/2020) for annual physical w. pap.  I discussed the assessment and treatment plan with the patient. The patient was provided an opportunity to  ask questions and all were answered. The patient agreed with the plan and demonstrated an understanding of the instructions.   The patient was advised to call back or seek an in-person evaluation if the symptoms worsen or if the condition fails to improve as anticipated.  The above assessment and management plan was discussed with the patient. The patient verbalized understanding of and has agreed to the management plan. Patient is aware to call the clinic if symptoms persist or worsen. Patient is aware when to return to the clinic for a follow-up visit. Patient educated on when it is appropriate to go to the emergency department.   Time call ended: 1:54 PM  I provided 12 minutes of non-face-to-face time during this encounter.  Hendricks Limes, MSN, APRN, FNP-C [facility] 08/30/20

## 2020-08-31 NOTE — Telephone Encounter (Signed)
Please ask patient to schedule an appointment with Raynelle Fanning. She can help with assistance to get this for the patient.

## 2020-08-31 NOTE — Telephone Encounter (Signed)
This medication is $1600

## 2020-09-02 ENCOUNTER — Other Ambulatory Visit: Payer: Self-pay

## 2020-09-02 ENCOUNTER — Other Ambulatory Visit: Payer: Managed Care, Other (non HMO)

## 2020-09-02 DIAGNOSIS — R7303 Prediabetes: Secondary | ICD-10-CM

## 2020-09-02 DIAGNOSIS — I10 Essential (primary) hypertension: Secondary | ICD-10-CM

## 2020-09-02 DIAGNOSIS — E782 Mixed hyperlipidemia: Secondary | ICD-10-CM

## 2020-09-02 DIAGNOSIS — E559 Vitamin D deficiency, unspecified: Secondary | ICD-10-CM

## 2020-09-02 LAB — BAYER DCA HB A1C WAIVED: HB A1C (BAYER DCA - WAIVED): 6.2 % (ref <7.0)

## 2020-09-03 LAB — CMP14+EGFR
ALT: 15 IU/L (ref 0–32)
AST: 11 IU/L (ref 0–40)
Albumin/Globulin Ratio: 1.6 (ref 1.2–2.2)
Albumin: 4 g/dL (ref 3.8–4.8)
Alkaline Phosphatase: 90 IU/L (ref 44–121)
BUN/Creatinine Ratio: 16 (ref 9–23)
BUN: 14 mg/dL (ref 6–24)
Bilirubin Total: 0.3 mg/dL (ref 0.0–1.2)
CO2: 21 mmol/L (ref 20–29)
Calcium: 9 mg/dL (ref 8.7–10.2)
Chloride: 106 mmol/L (ref 96–106)
Creatinine, Ser: 0.89 mg/dL (ref 0.57–1.00)
Globulin, Total: 2.5 g/dL (ref 1.5–4.5)
Glucose: 120 mg/dL — ABNORMAL HIGH (ref 65–99)
Potassium: 4.4 mmol/L (ref 3.5–5.2)
Sodium: 140 mmol/L (ref 134–144)
Total Protein: 6.5 g/dL (ref 6.0–8.5)
eGFR: 80 mL/min/{1.73_m2} (ref >59)

## 2020-09-03 LAB — CBC WITH DIFFERENTIAL/PLATELET
Basophils Absolute: 0 10*3/uL (ref 0.0–0.2)
Basos: 0 %
EOS (ABSOLUTE): 0.1 10*3/uL (ref 0.0–0.4)
Eos: 1 %
Hematocrit: 43.9 % (ref 34.0–46.6)
Hemoglobin: 14.4 g/dL (ref 11.1–15.9)
Immature Grans (Abs): 0 10*3/uL (ref 0.0–0.1)
Immature Granulocytes: 0 %
Lymphocytes Absolute: 5.2 10*3/uL — ABNORMAL HIGH (ref 0.7–3.1)
Lymphs: 47 %
MCH: 29.6 pg (ref 26.6–33.0)
MCHC: 32.8 g/dL (ref 31.5–35.7)
MCV: 90 fL (ref 79–97)
Monocytes Absolute: 0.6 10*3/uL (ref 0.1–0.9)
Monocytes: 6 %
Neutrophils Absolute: 5 10*3/uL (ref 1.4–7.0)
Neutrophils: 46 %
Platelets: 221 10*3/uL (ref 150–450)
RBC: 4.86 x10E6/uL (ref 3.77–5.28)
RDW: 12.8 % (ref 11.7–15.4)
WBC: 10.9 10*3/uL — ABNORMAL HIGH (ref 3.4–10.8)

## 2020-09-03 LAB — LIPID PANEL
Chol/HDL Ratio: 4.8 ratio — ABNORMAL HIGH (ref 0.0–4.4)
Cholesterol, Total: 178 mg/dL (ref 100–199)
HDL: 37 mg/dL — ABNORMAL LOW (ref >39)
LDL Chol Calc (NIH): 125 mg/dL — ABNORMAL HIGH (ref 0–99)
Triglycerides: 85 mg/dL (ref 0–149)
VLDL Cholesterol Cal: 16 mg/dL (ref 5–40)

## 2020-09-03 LAB — VITAMIN D 25 HYDROXY (VIT D DEFICIENCY, FRACTURES): Vit D, 25-Hydroxy: 28.4 ng/mL — ABNORMAL LOW (ref 30.0–100.0)

## 2020-09-06 ENCOUNTER — Other Ambulatory Visit: Payer: Self-pay | Admitting: Family Medicine

## 2020-09-06 DIAGNOSIS — E782 Mixed hyperlipidemia: Secondary | ICD-10-CM

## 2020-09-06 MED ORDER — ATORVASTATIN CALCIUM 40 MG PO TABS: 40.0000 mg | ORAL_TABLET | Freq: Every day | ORAL | 1 refills | Status: DC

## 2020-09-07 ENCOUNTER — Other Ambulatory Visit: Payer: Self-pay | Admitting: Family Medicine

## 2020-09-07 DIAGNOSIS — M25562 Pain in left knee: Secondary | ICD-10-CM

## 2020-09-07 DIAGNOSIS — N3281 Overactive bladder: Secondary | ICD-10-CM

## 2020-09-07 DIAGNOSIS — R7303 Prediabetes: Secondary | ICD-10-CM

## 2020-09-07 MED ORDER — MELOXICAM 7.5 MG PO TABS: 7.5000 mg | ORAL_TABLET | Freq: Every day | ORAL | 5 refills | Status: DC

## 2020-09-07 MED ORDER — TOLTERODINE TARTRATE 2 MG PO TABS: 1.0000 mg | ORAL_TABLET | Freq: Two times a day (BID) | ORAL | 1 refills | Status: DC

## 2020-09-07 MED ORDER — METFORMIN HCL ER 500 MG PO TB24: 500.0000 mg | ORAL_TABLET | Freq: Every day | ORAL | 2 refills | Status: DC

## 2020-09-13 ENCOUNTER — Other Ambulatory Visit: Payer: Self-pay | Admitting: *Deleted

## 2020-09-13 DIAGNOSIS — E782 Mixed hyperlipidemia: Secondary | ICD-10-CM

## 2020-09-13 DIAGNOSIS — F419 Anxiety disorder, unspecified: Secondary | ICD-10-CM

## 2020-09-13 DIAGNOSIS — I10 Essential (primary) hypertension: Secondary | ICD-10-CM

## 2020-09-13 MED ORDER — ATENOLOL 50 MG PO TABS: 50.0000 mg | ORAL_TABLET | Freq: Every day | ORAL | 0 refills | Status: DC

## 2020-09-13 MED ORDER — AMLODIPINE BESYLATE 5 MG PO TABS: 5.0000 mg | ORAL_TABLET | Freq: Every day | ORAL | 0 refills | Status: DC

## 2020-09-13 MED ORDER — ATORVASTATIN CALCIUM 40 MG PO TABS: 40.0000 mg | ORAL_TABLET | Freq: Every day | ORAL | 0 refills | Status: DC

## 2020-09-13 MED ORDER — ESCITALOPRAM OXALATE 20 MG PO TABS: 20.0000 mg | ORAL_TABLET | Freq: Every day | ORAL | 0 refills | Status: DC

## 2020-09-16 ENCOUNTER — Other Ambulatory Visit: Payer: Self-pay

## 2020-09-16 ENCOUNTER — Encounter: Payer: Self-pay | Admitting: Family Medicine

## 2020-09-16 ENCOUNTER — Ambulatory Visit (INDEPENDENT_AMBULATORY_CARE_PROVIDER_SITE_OTHER): Payer: Managed Care, Other (non HMO) | Admitting: Family Medicine

## 2020-09-16 ENCOUNTER — Other Ambulatory Visit (HOSPITAL_COMMUNITY)
Admission: RE | Admit: 2020-09-16 | Discharge: 2020-09-16 | Disposition: A | Discharge status: Home / Self Care | Payer: Managed Care, Other (non HMO) | Source: Ambulatory Visit | Attending: Family Medicine | Admitting: Family Medicine

## 2020-09-16 VITALS — BP 132/81 | HR 95 | Temp 98.0°F | Ht 65.0 in | Wt 227.6 lb

## 2020-09-16 DIAGNOSIS — Z124 Encounter for screening for malignant neoplasm of cervix: Secondary | ICD-10-CM

## 2020-09-16 DIAGNOSIS — Z0001 Encounter for general adult medical examination with abnormal findings: Secondary | ICD-10-CM | POA: Diagnosis not present

## 2020-09-16 DIAGNOSIS — Z1151 Encounter for screening for human papillomavirus (HPV): Secondary | ICD-10-CM | POA: Insufficient documentation

## 2020-09-16 DIAGNOSIS — Z0000 Encounter for general adult medical examination without abnormal findings: Secondary | ICD-10-CM

## 2020-09-16 DIAGNOSIS — Z113 Encounter for screening for infections with a predominantly sexual mode of transmission: Secondary | ICD-10-CM | POA: Insufficient documentation

## 2020-09-16 DIAGNOSIS — N898 Other specified noninflammatory disorders of vagina: Secondary | ICD-10-CM

## 2020-09-16 NOTE — Patient Instructions (Signed)
Preventive Care 68-47 Years Old, Female Preventive care refers to lifestyle choices and visits with your health care provider that can promote health and wellness. This includes: A yearly physical exam. This is also called an annual wellness visit. Regular dental and eye exams. Immunizations. Screening for certain conditions. Healthy lifestyle choices, such as: Eating a healthy diet. Getting regular exercise. Not using drugs or products that contain nicotine and tobacco. Limiting alcohol use. What can I expect for my preventive care visit? Physical exam Your health care provider will check your: Height and weight. These may be used to calculate your BMI (body mass index). BMI is a measurement that tells if you are at a healthy weight. Heart rate and blood pressure. Body temperature. Skin for abnormal spots. Counseling Your health care provider may ask you questions about your: Past medical problems. Family's medical history. Alcohol, tobacco, and drug use. Emotional well-being. Home life and relationship well-being. Sexual activity. Diet, exercise, and sleep habits. Work and work Statistician. Access to firearms. Method of birth control. Menstrual cycle. Pregnancy history. What immunizations do I need?  Vaccines are usually given at various ages, according to a schedule. Your health care provider will recommend vaccines for you based on your age, medicalhistory, and lifestyle or other factors, such as travel or where you work. What tests do I need? Blood tests Lipid and cholesterol levels. These may be checked every 5 years, or more often if you are over 37 years old. Hepatitis C test. Hepatitis B test. Screening Lung cancer screening. You may have this screening every year starting at age 30 if you have a 30-pack-year history of smoking and currently smoke or have quit within the past 15 years. Colorectal cancer screening. All adults should have this screening starting at  age 23 and continuing until age 3. Your health care provider may recommend screening at age 88 if you are at increased risk. You will have tests every 1-10 years, depending on your results and the type of screening test. Diabetes screening. This is done by checking your blood sugar (glucose) after you have not eaten for a while (fasting). You may have this done every 1-3 years. Mammogram. This may be done every 1-2 years. Talk with your health care provider about when you should start having regular mammograms. This may depend on whether you have a family history of breast cancer. BRCA-related cancer screening. This may be done if you have a family history of breast, ovarian, tubal, or peritoneal cancers. Pelvic exam and Pap test. This may be done every 3 years starting at age 79. Starting at age 54, this may be done every 5 years if you have a Pap test in combination with an HPV test. Other tests STD (sexually transmitted disease) testing, if you are at risk. Bone density scan. This is done to screen for osteoporosis. You may have this scan if you are at high risk for osteoporosis. Talk with your health care provider about your test results, treatment options,and if necessary, the need for more tests. Follow these instructions at home: Eating and drinking  Eat a diet that includes fresh fruits and vegetables, whole grains, lean protein, and low-fat dairy products. Take vitamin and mineral supplements as recommended by your health care provider. Do not drink alcohol if: Your health care provider tells you not to drink. You are pregnant, may be pregnant, or are planning to become pregnant. If you drink alcohol: Limit how much you have to 0-1 drink a day. Be aware  of how much alcohol is in your drink. In the [place] one drink equals one 12 oz bottle of beer (355 mL), one 5 oz glass of wine (148 mL), or one 1 oz glass of hard liquor (44 mL).  Lifestyle Take daily care of your teeth and  gums. Brush your teeth every morning and night with fluoride toothpaste. Floss one time each day. Stay active. Exercise for at least 30 minutes 5 or more days each week. Do not use any products that contain nicotine or tobacco, such as cigarettes, e-cigarettes, and chewing tobacco. If you need help quitting, ask your health care provider. Do not use drugs. If you are sexually active, practice safe sex. Use a condom or other form of protection to prevent STIs (sexually transmitted infections). If you do not wish to become pregnant, use a form of birth control. If you plan to become pregnant, see your health care provider for a prepregnancy visit. If told by your health care provider, take low-dose aspirin daily starting at age 29. Find healthy ways to cope with stress, such as: Meditation, yoga, or listening to music. Journaling. Talking to a trusted person. Spending time with friends and family. Safety Always wear your seat belt while driving or riding in a vehicle. Do not drive: If you have been drinking alcohol. Do not ride with someone who has been drinking. When you are tired or distracted. While texting. Wear a helmet and other protective equipment during sports activities. If you have firearms in your house, make sure you follow all gun safety procedures. What's next? Visit your health care provider once a year for an annual wellness visit. Ask your health care provider how often you should have your eyes and teeth checked. Stay up to date on all vaccines. This information is not intended to replace advice given to you by your health care provider. Make sure you discuss any questions you have with your healthcare provider. Document Revised: 11/24/2019 Document Reviewed: 10/31/2017 Elsevier Patient Education  2022 [employer].

## 2020-09-16 NOTE — Progress Notes (Signed)
Assessment & Plan:  1. Well adult exam Preventive health education provided. Patient declined COVID booster, colonoscopy, hepatitis C and HIV screening.  2. Screening for cervical cancer - Cytology - PAP  3. Routine screening for STI (sexually transmitted infection) - Cytology - PAP  4. Screening for human papillomavirus (HPV) - Cytology - PAP  5. Vaginal itching Patient would like to wait until pap comes back to see if it shoes anything.   Follow-up: Return for ASAP with Almyra Free (weight/Saxenda?), 4 months with me for Chronic F/U.   Hendricks Limes, MSN, APRN, FNP-C [facility]  Subjective:  Patient ID: Rebecca Garner, adult    DOB: 1975/08/09  Age: 45 y.o. MRN: [id number]  Patient Care Team: Loman Brooklyn, FNP as PCP - General (Family Medicine)   CC:  Chief Complaint  Patient presents with   Gynecologic Exam    HPI Rebecca Garner presents for her annual physical.   Occupation: CNA, Marital status: Single, Substance use: None Diet: None, Exercise: None Last eye exam: November 2021 Last dental exam: Does not go Last colonoscopy: Never Last mammogram: 05/17/2020 Last pap smear: 05/21/2017 Hepatitis C Screening: Declined Immunizations: Flu Vaccine:  not flu season Tdap Vaccine: up to date  COVID-19 Vaccine: up to date  [place] 2/9 Scores 07/19/2020 04/29/2020 02/01/2020 02/01/2020 01/19/2020 12/17/2019 04/16/2019  PHQ - 2 Score _0 0 0 0 0  PHQ- 9 Score _1 - - - 1     Review of Systems  Constitutional:  Negative for chills, fever, malaise/fatigue and weight loss.  HENT:  Negative for congestion, ear discharge, ear pain, nosebleeds, sinus pain, sore throat and tinnitus.   Eyes:  Negative for blurred vision, double vision, pain, discharge and redness.  Respiratory:  Negative for cough, shortness of breath and wheezing.   Cardiovascular:  Negative for chest pain, palpitations and leg swelling.  Gastrointestinal:  Negative  for abdominal pain, constipation, diarrhea, heartburn, nausea and vomiting.  Genitourinary:  Negative for dysuria, frequency and urgency.  Musculoskeletal:  Negative for myalgias.  Skin:  Negative for rash.  Neurological:  Negative for dizziness, seizures, weakness and headaches.  Psychiatric/Behavioral:  Negative for depression, substance abuse and suicidal ideas. The patient is not nervous/anxious.     Current Outpatient Medications:    albuterol (VENTOLIN HFA) 108 (90 Base) MCG/ACT inhaler, Inhale 2 puffs into the lungs every 6 (six) hours as needed for wheezing or shortness of breath., Disp: 18 g, Rfl: 5   amLODipine (NORVASC) 5 MG tablet, Take 1 tablet (5 mg total) by mouth daily., Disp: 90 tablet, Rfl: 0   atenolol (TENORMIN) 50 MG tablet, Take 1 tablet (50 mg total) by mouth daily., Disp: 90 tablet, Rfl: 0   atorvastatin (LIPITOR) 40 MG tablet, Take 1 tablet (40 mg total) by mouth daily., Disp: 90 tablet, Rfl: 0   escitalopram (LEXAPRO) 20 MG tablet, Take 1 tablet (20 mg total) by mouth daily., Disp: 90 tablet, Rfl: 0   hydrOXYzine (ATARAX/VISTARIL) 10 MG tablet, Take 1 tablet (10 mg total) by mouth 3 (three) times daily as needed., Disp: 90 tablet, Rfl: 1   Liraglutide -Weight Management (SAXENDA) 18 MG/3ML SOPN, Inject 0.6 mg into the skin daily., Disp: 3 mL, Rfl: 2   meloxicam (MOBIC) 7.5 MG tablet, Take 1 tablet (7.5 mg total) by mouth daily., Disp: 30 tablet, Rfl: 5   metFORMIN (GLUCOPHAGE-XR) 500 MG 24 hr tablet, Take 1 tablet (500 mg total) by mouth daily with breakfast.,  Disp: 30 tablet, Rfl: 2   tolterodine (DETROL) 2 MG tablet, Take 0.5 tablets (1 mg total) by mouth 2 (two) times daily., Disp: 90 tablet, Rfl: 1   Vitamin D, Ergocalciferol, (DRISDOL) 1.25 MG (50000 UNIT) CAPS capsule, Take 1 capsule (50,000 Units total) by mouth every 7 (seven) days., Disp: 12 capsule, Rfl: 2  No Known Allergies  Past Medical History:  Diagnosis Date   Anxiety    Hypertension    Mixed  hyperlipidemia 04/17/2019   Prediabetes 05/02/2020   Vitamin D deficiency 07/01/2017    History reviewed. No pertinent surgical history.  Family History  Problem Relation Age of Onset   Diabetes Mother    Hypertension Mother    Hypertension Sister     Social History   Socioeconomic History   Marital status: Divorced    Spouse name: Not on file   Number of children: Not on file   Years of education: Not on file   Highest education level: Not on file  Occupational History   Not on file  Tobacco Use   Smoking status: Every Day    Packs/day: 0.50    Years: 15.00    Pack years: 7.50    Types: Cigarettes   Smokeless tobacco: Never  Vaping Use   Vaping Use: Never used  Substance and Sexual Activity   Alcohol use: Never   Drug use: Never   Sexual activity: Not Currently  Other Topics Concern   Not on file  Social History Narrative   Not on file   Social Determinants of Health   Financial Resource Strain: Not on file  Food Insecurity: Not on file  Transportation Needs: Not on file  Physical Activity: Not on file  Stress: Not on file  Social Connections: Not on file  Intimate Partner Violence: Not on file      Objective:    BP 132/81   Pulse 95   Temp 98 F (36.7 C) (Temporal)   Ht _0  (1.651 m)   Wt 227 lb 9.6 oz (103.2 kg)   SpO2 94%   BMI 37.87 kg/m   Wt Readings from Last 3 Encounters:  09/16/20 227 lb 9.6 oz (103.2 kg)  07/19/20 228 lb (103.4 kg)  04/29/20 226 lb 6.4 oz (102.7 kg)    Physical Exam Vitals reviewed. Exam conducted with a chaperone present.  Constitutional:      General: She is not in acute distress.    Appearance: Normal appearance. She is obese. She is not ill-appearing, toxic-appearing or diaphoretic.  HENT:     Head: Normocephalic and atraumatic.     Right Ear: Tympanic membrane, ear canal and external ear normal. There is no impacted cerumen.     Left Ear: Tympanic membrane, ear canal and external ear normal. There is no  impacted cerumen.     Nose: Nose normal. No congestion or rhinorrhea.     Mouth/Throat:     Mouth: Mucous membranes are moist.     Pharynx: Oropharynx is clear. No oropharyngeal exudate or posterior oropharyngeal erythema.  Eyes:     General: No scleral icterus.       Right eye: No discharge.        Left eye: No discharge.     Conjunctiva/sclera: Conjunctivae normal.     Pupils: Pupils are equal, round, and reactive to light.  Cardiovascular:     Rate and Rhythm: Normal rate and regular rhythm.     Heart sounds: Normal heart sounds. No murmur heard.  No friction rub. No gallop.  Pulmonary:     Effort: Pulmonary effort is normal. No respiratory distress.     Breath sounds: Normal breath sounds. No stridor. No wheezing, rhonchi or rales.  Abdominal:     General: Abdomen is flat. Bowel sounds are normal. There is no distension.     Palpations: Abdomen is soft. There is no mass.     Tenderness: There is no abdominal tenderness. There is no guarding or rebound.     Hernia: No hernia is present.  Genitourinary:    Pubic Area: No rash or pubic lice.      Labia:        Right: No rash, tenderness, lesion or injury.        Left: No rash, tenderness, lesion or injury.      Vagina: Normal.     Cervix: Normal.     Uterus: Normal.      Adnexa: Right adnexa normal and left adnexa normal.  Musculoskeletal:        General: Normal range of motion.     Cervical back: Normal range of motion and neck supple. No rigidity. No muscular tenderness.  Lymphadenopathy:     Cervical: No cervical adenopathy.  Skin:    General: Skin is warm and dry.     Capillary Refill: Capillary refill takes less than 2 seconds.  Neurological:     General: No focal deficit present.     Mental Status: She is alert and oriented to person, place, and time. Mental status is at baseline.  Psychiatric:        Mood and Affect: Mood normal.        Behavior: Behavior normal.        Thought Content: Thought content normal.         Judgment: Judgment normal.    Lab Results  Component Value Date   TSH 0.580 04/16/2019   Lab Results  Component Value Date   WBC 10.9 (H) 09/02/2020   HGB 14.4 09/02/2020   HCT 43.9 09/02/2020   MCV 90 09/02/2020   PLT 221 09/02/2020   Lab Results  Component Value Date   NA 140 09/02/2020   K 4.4 09/02/2020   CO2 21 09/02/2020   GLUCOSE 120 (H) 09/02/2020   BUN 14 09/02/2020   CREATININE 0.89 09/02/2020   BILITOT 0.3 09/02/2020   ALKPHOS 90 09/02/2020   AST 11 09/02/2020   ALT 15 09/02/2020   PROT 6.5 09/02/2020   ALBUMIN 4.0 09/02/2020   CALCIUM 9.0 09/02/2020   EGFR 80 09/02/2020   Lab Results  Component Value Date   CHOL 178 09/02/2020   Lab Results  Component Value Date   HDL 37 (L) 09/02/2020   Lab Results  Component Value Date   LDLCALC 125 (H) 09/02/2020   Lab Results  Component Value Date   TRIG 85 09/02/2020   Lab Results  Component Value Date   CHOLHDL 4.8 (H) 09/02/2020   Lab Results  Component Value Date   HGBA1C 6.2 09/02/2020

## 2020-09-18 ENCOUNTER — Encounter: Payer: Self-pay | Admitting: Family Medicine

## 2020-09-20 ENCOUNTER — Telehealth: Payer: Self-pay | Admitting: Family Medicine

## 2020-09-20 LAB — CYTOLOGY - PAP
Adequacy: ABSENT
Chlamydia: NEGATIVE
Comment: NEGATIVE
Comment: NEGATIVE
Comment: NEGATIVE
Comment: NORMAL
Diagnosis: NEGATIVE
High risk HPV: NEGATIVE
Neisseria Gonorrhea: NEGATIVE
Trichomonas: NEGATIVE

## 2020-09-20 NOTE — Telephone Encounter (Signed)
Pt returned missed call. Reviewed PAP result with pt per Britneys note. Pt voiced understanding.

## 2020-09-20 NOTE — Telephone Encounter (Signed)
Lmtcb.

## 2020-09-20 NOTE — Telephone Encounter (Signed)
Please review pap results.

## 2020-09-20 NOTE — Telephone Encounter (Signed)
Pt wants to be called with her PAP results.

## 2020-09-20 NOTE — Telephone Encounter (Signed)
Pap smear was normal.

## 2020-09-29 ENCOUNTER — Telehealth: Payer: Self-pay | Admitting: Pharmacist

## 2020-09-29 ENCOUNTER — Other Ambulatory Visit: Payer: Self-pay

## 2020-09-29 ENCOUNTER — Ambulatory Visit (INDEPENDENT_AMBULATORY_CARE_PROVIDER_SITE_OTHER): Payer: Managed Care, Other (non HMO) | Admitting: Pharmacist

## 2020-09-29 DIAGNOSIS — R7303 Prediabetes: Secondary | ICD-10-CM | POA: Diagnosis not present

## 2020-09-29 DIAGNOSIS — E8881 Metabolic syndrome: Secondary | ICD-10-CM

## 2020-09-29 MED ORDER — TIRZEPATIDE 2.5 MG/0.5ML ~~LOC~~ SOAJ: 2.5000 mg | SUBCUTANEOUS | 0 refills | Status: DC

## 2020-09-29 NOTE — Progress Notes (Signed)
      09/29/20 Name: Rebecca Garner   MRN: [id number]  DOB: [birth year]     S:  26 yoF Presents for weight loss evaluation, education, and management.  Patient was referred and last seen by Primary Care Provider on 12/28/19.  She would like to lose weight and interested in medication to assist. She has tried this before, but is now motivated to participate with  She is currently 227lbs.   Insurance coverage/medication affordability: cigna     Patient reports adherence with medications. Current medications for weight loss: n/a   Patient is active during the day. 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  Encouraged patient to increase exercise for better outcomes    Discussed meal planning options and Plate method for healthy eating Avoid sugary drinks and desserts Incorporate balanced protein, non starchy veggies, 1 serving of carbohydrate with each meal Increase water intake Increase physical activity as able   Goal weight is around  160-170lbs      A/P:   -Healthy eating and meal planning discussed   -Increase water and exercise   -Will start Mounjaro 2.5mg  sq weekly due to ease of copay card and given patient has pre-diabetes & metabolic syndrome.  We have tried to get saxenda approved, however patient could not afford $1000.  Will f/u with patient in 4 weeks for titration. Patient denies history of thyroid/medullary cancer. Counseled patient on: Eating low fat smaller meals to reduce side effects Decrease carbonated beverages Get more steps in!   Written patient instructions provided.  Total time in face to face counseling 25 minutes.   Kieth Brightly, PharmD, BCPS Clinical Pharmacist, [facility]  II Phone [phone]

## 2020-09-29 NOTE — Telephone Encounter (Signed)
PA submitted--okay if not covered, copay card given to patient that will carry her cost for 1 year

## 2020-10-06 NOTE — Telephone Encounter (Signed)
Rebecca Garner - PA Case ID: [id number] - Rx #: [id number] Need help? Call us at [phone] Outcome Deniedon August 3 CaseId:[id number];Status:Denied;Review Type:;Appeal Information: Attention:ATTN: CLINICAL APPEALS DEPARTMENT [employer] [id number]. [phone] Phone:[phone] [phone]; Important - Please read the below note on eAppeals: Please reference the denial letter for information on the rights for an appeal, rationale for the denial, and how to submit an appeal including if any information is needed to support the appeal. Note about urgent situations - Generally, an urgent situation is one which, in the opinion of the provider, the health of the patient may be in serious jeopardy or may experience pain that cannot be adequately controlled while waiting for a decision on the appeal.; Drug Mounjaro 2.5MG /0.5ML pen-injectors Form [employer] Electronic PA Form (2017 NCPDP) Original Claim Info 75

## 2020-10-19 ENCOUNTER — Ambulatory Visit (INDEPENDENT_AMBULATORY_CARE_PROVIDER_SITE_OTHER): Payer: Managed Care, Other (non HMO) | Admitting: Pulmonary Disease

## 2020-10-19 ENCOUNTER — Institutional Professional Consult (permissible substitution): Payer: Managed Care, Other (non HMO) | Admitting: Pulmonary Disease

## 2020-10-19 ENCOUNTER — Other Ambulatory Visit: Payer: Self-pay

## 2020-10-19 ENCOUNTER — Encounter: Payer: Self-pay | Admitting: Pulmonary Disease

## 2020-10-19 VITALS — BP 138/88 | HR 93 | Temp 99.2°F | Ht 66.0 in | Wt 230.0 lb

## 2020-10-19 DIAGNOSIS — R0683 Snoring: Secondary | ICD-10-CM

## 2020-10-19 DIAGNOSIS — G4733 Obstructive sleep apnea (adult) (pediatric): Secondary | ICD-10-CM

## 2020-10-19 NOTE — Assessment & Plan Note (Signed)
Given excessive daytime somnolence, narrow pharyngeal exam, witnessed apneas & loud snoring, obstructive sleep apnea is very likely & an overnight polysomnogram will be scheduled as a home study. The pathophysiology of obstructive sleep apnea , it's cardiovascular consequences & modes of treatment including CPAP were discused with the patient in detail & they evidenced understanding. Pretest probability is high.  Even though she is apprehensive about CPAP, she would be willing to use if needed

## 2020-10-19 NOTE — Patient Instructions (Signed)
Home sleep test  We will follow up with you once we get the results.

## 2020-10-19 NOTE — Assessment & Plan Note (Signed)
Discussed correlation of weight with OSA. Weight loss measures encouraged

## 2020-10-19 NOTE — Progress Notes (Signed)
Subjective:    Patient ID: Rebecca Garner, adult    DOB: November 21, 1975, 45 y.o.   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 Loud snoring has been noticed by her mother and boyfriend and she reports gasping episodes that occasionally wake her up from sleep. She reports nonrefreshing sleep in spite of adequate sleep quality.  Epworth sleepiness score is 10 and she reports sleepiness especially in the afternoons and sitting quietly after lunch. She naps daily after coming back from work for about 2 hours.  Bedtime is between 8 and 9 PM, sleep latency about 20 minutes, she sleeps on her side on her stomach with 1 pillow.  She has increased gasping episodes when she sleeps on her back.  She reports at least 2-3 nocturnal awakenings including nocturia and is out of bed by 6 AM feeling fatigued with dryness of mouth and occasional headaches. Her weight is fluctuated within 5 pounds of her current.  She has just been started on Ozempic for weight loss  There is no history suggestive of cataplexy, sleep paralysis or parasomnias  PMH -diabetes, hypertension, hyperlipidemia She smokes about half pack per day, 15 pack years   Past Medical History:  Diagnosis Date   Anxiety    Hypertension    Mixed hyperlipidemia 04/17/2019   Prediabetes 05/02/2020   Vitamin D deficiency 07/01/2017   No past surgical history on file.  No Known Allergies  Social History   Socioeconomic History   Marital status: Divorced    Spouse name: Not on file   Number of children: Not on file   Years of education: Not on file   Highest education level: Not on file  Occupational History   Not on file  Tobacco Use   Smoking status: Every Day    Packs/day: 0.50    Years: 15.00    Pack years: 7.50    Types: Cigarettes   Smokeless tobacco: Never  Vaping Use   Vaping Use: Never used  Substance and Sexual Activity   Alcohol use: Never   Drug use: Never    Sexual activity: Not Currently  Other Topics Concern   Not on file  Social History Narrative   Not on file   Social Determinants of Health   Financial Resource Strain: Not on file  Food Insecurity: Not on file  Transportation Needs: Not on file  Physical Activity: Not on file  Stress: Not on file  Social Connections: Not on file  Intimate Partner Violence: Not on file    Family History  Problem Relation Age of Onset   Diabetes Mother    Hypertension Mother    Hypertension Sister       Review of Systems Constitutional: negative for anorexia, fevers and sweats  Eyes: negative for irritation, redness and visual disturbance  Ears, nose, mouth, throat, and face: negative for earaches, epistaxis, nasal congestion and sore throat  Respiratory: negative for cough, dyspnea on exertion, sputum and wheezing  Cardiovascular: negative for chest pain, dyspnea, lower extremity edema, orthopnea, palpitations and syncope  Gastrointestinal: negative for abdominal pain, constipation, diarrhea, melena, nausea and vomiting  Genitourinary:negative for dysuria, frequency and hematuria  Hematologic/lymphatic: negative for bleeding, easy bruising and lymphadenopathy  Musculoskeletal:negative for arthralgias, muscle weakness and stiff joints  Neurological: negative for coordination problems, gait problems, headaches and weakness  Endocrine: negative for diabetic symptoms including polydipsia, polyuria and weight loss     Objective:   Physical Exam  Gen. Pleasant, obese, in no distress, normal affect ENT - no pallor,icterus, no post nasal drip, class 2 airway Neck: No JVD, no thyromegaly, no carotid bruits Lungs: no use of accessory muscles, no dullness to percussion, decreased without rales or rhonchi  Cardiovascular: Rhythm regular, heart sounds  normal, no murmurs or gallops, 1+ peripheral edema Abdomen: soft and non-tender, no hepatosplenomegaly, BS normal. Musculoskeletal: No deformities,  no cyanosis or clubbing Neuro:  alert, non focal, no tremors       Assessment & Plan:

## 2020-10-20 ENCOUNTER — Ambulatory Visit (INDEPENDENT_AMBULATORY_CARE_PROVIDER_SITE_OTHER): Payer: Managed Care, Other (non HMO) | Admitting: Pharmacist

## 2020-10-20 DIAGNOSIS — R7303 Prediabetes: Secondary | ICD-10-CM | POA: Diagnosis not present

## 2020-10-20 MED ORDER — TIRZEPATIDE 5 MG/0.5ML ~~LOC~~ SOAJ: 5.0000 mg | SUBCUTANEOUS | 1 refills | Status: DC

## 2020-10-20 NOTE — Progress Notes (Signed)
    10/20/20  Name: Rebecca Garner   MRN: [id number]  DOB: 1975-09-12  S:  71 yoF Presents for weight loss evaluation, education, and management.  Patient was referred and last seen by Primary Care Provider on 09/16/20.  Patient continues of Mounjaro for prediabetes/weight loss (week 3).  She reports no side effects, however she has not lost weight.  She reports feeling more full and would like to continue on this medication.   Insurance coverage/medication affordability: cigna     Patient reports adherence with medications. Current medications for weight loss: Mounjaro 2.5mg  sq weekly   Patient is active during the day. 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  Encouraged patient to increase exercise for better outcomes    Discussed meal planning options and Plate method for healthy eating Avoid sugary drinks and desserts Incorporate balanced protein, non starchy veggies, 1 serving of carbohydrate with each meal Increase water intake Increase physical activity as able   Goal weight is around  160-170lbs      A/P:   -Healthy eating and meal planning discussed   -Increase water and exercise   -Will INCREASE Mounjaro to 5mg  sq weekly due to ease of copay card and given patient has pre-diabetes & metabolic syndrome.  We have tried to get saxenda approved, however patient could not afford $1000.  Will f/u with patient in 4 weeks for titration. Patient denies history of thyroid/medullary cancer. Counseled patient on: Eating low fat smaller meals to reduce side effects Decrease carbonated beverages Get more steps in!   Written patient instructions provided.  Total time in face to face counseling 25 minutes.   , PharmD, BCPS Clinical Pharmacist, [facility]  II Phone [phone]

## 2020-10-24 ENCOUNTER — Telehealth: Payer: Self-pay | Admitting: *Deleted

## 2020-10-24 DIAGNOSIS — R7303 Prediabetes: Secondary | ICD-10-CM

## 2020-10-24 NOTE — Telephone Encounter (Signed)
Mounjaro 5MG /0.5ML pen-injectors Key: [id number] Sent to plan

## 2020-10-26 NOTE — Telephone Encounter (Signed)
PA denied. Patient can print copay card off line and we can send to local pharmacy for cash price. Please advise.

## 2020-10-28 NOTE — Telephone Encounter (Signed)
Yes! Patient needs to go online and print copay  card (or she can show [facility] on her phone) google "mounjaro copay card"  This copay card will work regardless of insurance approval/denial ($25 for 12 fills only)  after that she will get insurance approval or switch to another medication

## 2020-11-22 ENCOUNTER — Ambulatory Visit (INDEPENDENT_AMBULATORY_CARE_PROVIDER_SITE_OTHER): Payer: Managed Care, Other (non HMO) | Admitting: Pharmacist

## 2020-11-22 DIAGNOSIS — R7303 Prediabetes: Secondary | ICD-10-CM

## 2020-11-22 MED ORDER — TIRZEPATIDE 5 MG/0.5ML ~~LOC~~ SOAJ: 5.0000 mg | SUBCUTANEOUS | 2 refills | Status: DC

## 2020-11-22 NOTE — Progress Notes (Signed)
.     11/22/2020 Name: Rebecca Garner MRN: [id number] DOB: 1975/10/12   S:  45 yoF Presents for weight loss evaluation, education, and management.  Patient was referred and last seen by Primary Care Provider on 09/16/20.  Patient continues on [facility] for prediabetes/weight loss.  She states she can tell a difference a reports ~5lbs weight loss.  Denies side effects.  She is ready to go the next dose (5mg  sq weekly).  She reports feeling more full and would like to continue on this medication.   Insurance coverage/medication affordability: cigna    Patient reports adherence with medications. Current medications for weight loss: Mounjaro 2.5mg  sq weekly   Patient is active during the day. She is active on her job and currently works in .  Encouraged patient to increase exercise for better outcomes    Discussed meal planning options and Plate method for healthy eating Avoid sugary drinks and desserts Incorporate balanced protein, non starchy veggies, 1 serving of carbohydrate with each meal Increase water intake Increase physical activity as able   Goal weight is around  160-170lbs       O:  Lab Results  Component Value Date   HGBA1C 6.2 09/02/2020    Lipid Panel     Component Value Date/Time   CHOL 178 09/02/2020 1005   TRIG 85 09/02/2020 1005   HDL 37 (L) 09/02/2020 1005   CHOLHDL 4.8 (H) 09/02/2020 1005   LDLCALC 125 (H) 09/02/2020 1005    A/P:  -Healthy eating and meal planning discussed   -Increase water and exercise   -Will INCREASE Mounjaro to 5mg  sq weekly (patient hasn't been able [facility] get [facility], therefore will send to the drug store in [place]; patient verbalizes understanding and is in agreement with this plan) Copay card faxed to [facility].  due to ease of copay card and given patient has pre-diabetes & metabolic syndrome.  We have tried to get saxenda approved, however patient could not afford $1000.  Will f/u with patient in 4 weeks for  titration. Patient denies history of thyroid/medullary cancer. Counseled patient on: Eating low fat smaller meals to reduce side effects Decrease carbonated beverages Get more steps in!   Written patient instructions provided.  Total time in face to face counseling 25 minutes.  , PharmD, BCPS Clinical Pharmacist, [facility]  II Phone [phone]

## 2020-11-24 ENCOUNTER — Other Ambulatory Visit: Payer: Self-pay | Admitting: Family Medicine

## 2020-11-24 DIAGNOSIS — E782 Mixed hyperlipidemia: Secondary | ICD-10-CM

## 2020-12-22 ENCOUNTER — Ambulatory Visit (INDEPENDENT_AMBULATORY_CARE_PROVIDER_SITE_OTHER): Payer: Managed Care, Other (non HMO) | Admitting: Pharmacist

## 2020-12-22 DIAGNOSIS — R7303 Prediabetes: Secondary | ICD-10-CM | POA: Diagnosis not present

## 2020-12-22 NOTE — Progress Notes (Signed)
      12/22/2020 Name: Rebecca Garner  MRN: [id number]       DOB: 1975-08-21     S:  45 yoF Presents for weight loss evaluation, education, and management.  Patient was referred and last seen by Primary Care Provider on 09/16/20.  Patient continues on [facility] for prediabetes/weight loss.  She states she can tell a difference a reports ~8-10lbs weight loss.  Denies side effects.  She is ready to go the next dose (5mg  sq weekly), however she has been unable to pick up refill due to cost..  She reports feeling more full and would like to continue on this medication.   Insurance coverage/medication affordability: cigna    Patient reports adherence with medications. Current medications for weight loss: Mounjaro 2.5mg  sq weekly   Patient is active during the day. She is active on her job and currently works in .  Encouraged patient to increase exercise for better outcomes    Discussed meal planning options and Plate method for healthy eating Avoid sugary drinks and desserts Incorporate balanced protein, non starchy veggies, 1 serving of carbohydrate with each meal Increase water intake Increase physical activity as able   Goal weight is around  160-170lbs        O:        Lab Results  Component Value Date    HGBA1C 6.2 09/02/2020      Lipid Panel           Component Value Date/Time    CHOL 178 09/02/2020 1005    TRIG 85 09/02/2020 1005    HDL 37 (L) 09/02/2020 1005    CHOLHDL 4.8 (H) 09/02/2020 1005    LDLCALC 125 (H) 09/02/2020 1005      A/P:   -Healthy eating and meal planning discussed   -Increase water and exercise   -Encouraged patient to pick up Mounjaro to 5mg  sq weekly (patient hasn't been able to get [facility] or pick up due to cost $25 copay), therefore will send to the drug store in [place]; patient verbalizes understanding and is in agreement with this plan) Copay card sent to patient   -Continue metformin  -We have tried to get saxenda approved,  however patient could not afford $1000.    -Patient denies history of thyroid/medullary cancer.  Counseled patient on: Eating low fat smaller meals to reduce side effects Decrease carbonated beverages Get more steps in!   Written patient instructions provided.  Total time in face to face counseling 25 minutes.   11/03/2020, PharmD, BCPS Clinical Pharmacist, [facility]  II Phone [phone]

## 2020-12-29 ENCOUNTER — Other Ambulatory Visit: Payer: Self-pay

## 2020-12-29 ENCOUNTER — Ambulatory Visit: Payer: Managed Care, Other (non HMO)

## 2020-12-29 DIAGNOSIS — R0683 Snoring: Secondary | ICD-10-CM

## 2020-12-29 DIAGNOSIS — G4733 Obstructive sleep apnea (adult) (pediatric): Secondary | ICD-10-CM

## 2021-01-12 ENCOUNTER — Telehealth: Payer: Self-pay | Admitting: Pulmonary Disease

## 2021-01-12 DIAGNOSIS — G4733 Obstructive sleep apnea (adult) (pediatric): Secondary | ICD-10-CM

## 2021-01-12 NOTE — Telephone Encounter (Signed)
Called and spoke to patient about HST results. She voiced understanding and is going to call to schedule an OV after she gets cpap set up for 6 weeks after starting

## 2021-01-12 NOTE — Telephone Encounter (Signed)
HST showed severe  OSA with AHI 28/ hr Suggest autoCPAP  5-15 cm, mask of choice OV with me/APP in 6 wks after starting

## 2021-01-17 ENCOUNTER — Ambulatory Visit: Payer: Managed Care, Other (non HMO) | Admitting: Family Medicine

## 2021-01-24 ENCOUNTER — Encounter: Payer: Self-pay | Admitting: Family Medicine

## 2021-02-01 ENCOUNTER — Other Ambulatory Visit: Payer: Self-pay

## 2021-02-01 ENCOUNTER — Ambulatory Visit (INDEPENDENT_AMBULATORY_CARE_PROVIDER_SITE_OTHER): Payer: Managed Care, Other (non HMO) | Admitting: Family Medicine

## 2021-02-01 ENCOUNTER — Encounter: Payer: Self-pay | Admitting: Family Medicine

## 2021-02-01 VITALS — BP 148/89 | HR 76 | Temp 97.7°F | Ht 66.0 in | Wt 228.2 lb

## 2021-02-01 DIAGNOSIS — Z23 Encounter for immunization: Secondary | ICD-10-CM | POA: Diagnosis not present

## 2021-02-01 DIAGNOSIS — M1990 Unspecified osteoarthritis, unspecified site: Secondary | ICD-10-CM

## 2021-02-01 DIAGNOSIS — L84 Corns and callosities: Secondary | ICD-10-CM

## 2021-02-01 DIAGNOSIS — F331 Major depressive disorder, recurrent, moderate: Secondary | ICD-10-CM | POA: Insufficient documentation

## 2021-02-01 DIAGNOSIS — F419 Anxiety disorder, unspecified: Secondary | ICD-10-CM

## 2021-02-01 DIAGNOSIS — E782 Mixed hyperlipidemia: Secondary | ICD-10-CM | POA: Diagnosis not present

## 2021-02-01 DIAGNOSIS — R7303 Prediabetes: Secondary | ICD-10-CM | POA: Diagnosis not present

## 2021-02-01 DIAGNOSIS — I10 Essential (primary) hypertension: Secondary | ICD-10-CM

## 2021-02-01 DIAGNOSIS — E559 Vitamin D deficiency, unspecified: Secondary | ICD-10-CM

## 2021-02-01 DIAGNOSIS — G4733 Obstructive sleep apnea (adult) (pediatric): Secondary | ICD-10-CM

## 2021-02-01 LAB — BAYER DCA HB A1C WAIVED: HB A1C (BAYER DCA - WAIVED): 6.1 % — ABNORMAL HIGH (ref 4.8–5.6)

## 2021-02-01 MED ORDER — ATORVASTATIN CALCIUM 40 MG PO TABS: 40.0000 mg | ORAL_TABLET | Freq: Every day | ORAL | 1 refills | Status: DC

## 2021-02-01 MED ORDER — HYDROXYZINE HCL 10 MG PO TABS: 10.0000 mg | ORAL_TABLET | Freq: Three times a day (TID) | ORAL | 1 refills | Status: DC | PRN

## 2021-02-01 MED ORDER — FLUOXETINE HCL 20 MG PO CAPS: 20.0000 mg | ORAL_CAPSULE | Freq: Every day | ORAL | 2 refills | Status: DC

## 2021-02-01 MED ORDER — MELOXICAM 7.5 MG PO TABS: 7.5000 mg | ORAL_TABLET | Freq: Every day | ORAL | 5 refills | Status: DC

## 2021-02-01 MED ORDER — METFORMIN HCL ER 500 MG PO TB24: 500.0000 mg | ORAL_TABLET | Freq: Every day | ORAL | 1 refills | Status: DC

## 2021-02-01 MED ORDER — AMLODIPINE BESYLATE 10 MG PO TABS: 10.0000 mg | ORAL_TABLET | Freq: Every day | ORAL | 2 refills | Status: DC

## 2021-02-01 MED ORDER — ATENOLOL 50 MG PO TABS
50.0000 mg | ORAL_TABLET | Freq: Every day | ORAL | 1 refills | Status: DC
Start: 2021-02-01 — End: 2021-11-10

## 2021-02-01 NOTE — Progress Notes (Signed)
Assessment & Plan:  1. Prediabetes - encouraged healthy diet and exercise  - continue Mounjaro as soon as able - [employer] DCA Hb A1c Waived - metFORMIN (GLUCOPHAGE-XR) 500 MG 24 hr tablet; Take 1 tablet (500 mg total) by mouth daily with breakfast.  Dispense: 90 tablet; Refill: 1  2. Essential (primary) hypertension - uncontrolled, increase amlodipine from 5 mg to 10 mg  - education provided on the DASH diet - encouraged healthy diet and exercise  - CBC with Differential/Platelet - CMP14+EGFR - amLODipine (NORVASC) 10 MG tablet; Take 1 tablet (10 mg total) by mouth daily.  Dispense: 30 tablet; Refill: 2 - atenolol (TENORMIN) 50 MG tablet; Take 1 tablet (50 mg total) by mouth daily.  Dispense: 90 tablet; Refill: 1  3. Mixed hyperlipidemia - continue atorvastatin - Lipid panel - atorvastatin (LIPITOR) 40 MG tablet; Take 1 tablet (40 mg total) by mouth daily.  Dispense: 90 tablet; Refill: 1  4. Morbid obesity ([place]) - encouraged healthy diet and exercise Weight when starting Mounjaro: 227 lbs  Today's weight: 228 lbs Total weight lost: none, +1 lb  5. Anxiety - uncontrolled, change from Lexapro to Prozac 20 mg daily - hydrOXYzine (ATARAX) 10 MG tablet; Take 1 tablet (10 mg total) by mouth 3 (three) times daily as needed.  Dispense: 90 tablet; Refill: 1 - FLUoxetine (PROZAC) 20 MG capsule; Take 1 capsule (20 mg total) by mouth daily.  Dispense: 30 capsule; Refill: 2  6. Moderate recurrent major depression (HCC) - uncontrolled, change from Lexapro to Prozac 20 mg daily - FLUoxetine (PROZAC) 20 MG capsule; Take 1 capsule (20 mg total) by mouth daily.  Dispense: 30 capsule; Refill: 2  7. Vitamin D deficiency - vitamin D level today   8. OSA (obstructive sleep apnea) - continue using CPAP at night and following up with pulmonology  9. Arthritis - controlled with meloxicam - meloxicam (MOBIC) 7.5 MG tablet; Take 1 tablet (7.5 mg total) by mouth daily.  Dispense: 30 tablet; Refill:  5  10. Need for immunization against influenza - influenza vaccine today  11. Foot callus - ambulatory referral to podiatry   Return in about 6 weeks (around 03/15/2021) for HTN & Anxiety/Depression.  Lucile Crater, NP Student  I personally was present during the history, physical exam, and medical decision-making activities of this service and have verified that the service and findings are accurately documented in the nurse practitioner student's note.  Hendricks Limes, MSN, APRN, FNP-C [facility]   Subjective:    Patient ID: Rebecca Garner, adult    DOB: [birth year], 45 y.o.   MRN: [id number]  Patient Care Team: Loman Brooklyn, FNP as PCP - General (Family Medicine) Lavera Guise, [facility] as Pharmacist (Family Medicine)   Chief Complaint:  Chief Complaint  Patient presents with   Prediabetes   Hypertension    4 month follow up     HPI: Rebecca Garner is a 45 y.o. adult presenting on 02/01/2021 for Prediabetes and Hypertension (4 month follow up )  Hypertension: Patient reports her blood pressure readings are elevated at home as well, getting readings around 160/80. She does eat a low-salt diet. She does sometimes have blurry vision and pains in the back of her head. Denies dizziness. She is taking Norvasc 5 mg daily and atenolol 50 mg daily.  Hyperlipidemia: she is taking atorvastatin 40 mg daily, which was last increased in July 2022.  Prediabetes: Patient reports her blood sugar is up to 300 during random times  of the day.  She is eating healthy and walking for exercise. She was started on Mounjaro 5 mg and is also taking metformin 500 mg daily. She states she has had difficulty getting Mounjaro due to being out of work with [employer] recently so she has been off of it for a few weeks. She states she will resume when she can afford the copay.   Sleep apnea: She started using her CPAP this week and states she is getting used to it but sleeping better. This is  managed by Dr. Elsworth Soho, pulmonologist.  Arthritis: controlled with meloxicam.  Vitamin D deficiency: she is taking 50,000 units weekly. Last vitamin D level still low in July 2022.  Anxiety/Depression: She is taking lexapro 20 mg and hydroxyzine 10 mg TID. 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 She is agreeable to trying another medication to treat her anxiety and depression. She states she does not have suicidal thoughts but feels that she would be better off dead occasionally. She denies a plan to harm herself or wish to do so.  Depression screen [facility] 2/9 02/01/2021 09/16/2020 07/19/2020  Decreased Interest _0 Down, Depressed, Hopeless 2 0 2  PHQ - 2 Score _1 Altered sleeping _2 Tired, decreased energy _3 Change in appetite _4 Feeling bad or failure about yourself  2 0 1  Trouble concentrating 2 0 0  Moving slowly or fidgety/restless 2 0 0  Suicidal thoughts 2 0 0  PHQ-9 Score _5 Difficult doing work/chores Somewhat difficult Somewhat difficult Somewhat difficult   GAD 7 : Generalized Anxiety Score 02/01/2021 09/16/2020 07/19/2020 04/29/2020  Nervous, Anxious, on Edge _6 Control/stop worrying _7 Worry too much - different things _8 Trouble relaxing _9 0  Restless _10 Easily annoyed or irritable 2 0 2 1  Afraid - awful might happen _11 Total GAD 7 Score _12 Anxiety Difficulty Somewhat difficult Somewhat difficult Somewhat difficult Somewhat difficult    New complaints: She reports pain in her left lateral foot that is worse with wearing shoes.    Social history:  Relevant past medical, surgical, family and social history reviewed and updated as indicated. Interim medical history since our last visit reviewed.  Allergies and medications reviewed and updated.  DATA REVIEWED: CHART IN EPIC  ROS: Negative unless specifically indicated above in  HPI.    Current Outpatient Medications:    albuterol (VENTOLIN HFA) 108 (90 Base) MCG/ACT inhaler, Inhale 2 puffs into the lungs every 6 (six) hours as needed for wheezing or shortness of breath., Disp: 18 g, Rfl: 5   FLUoxetine (PROZAC) 20 MG capsule, Take 1 capsule (20 mg total) by mouth daily., Disp: 30 capsule, Rfl: 2   tirzepatide (MOUNJARO) 5 MG/0.5ML Pen, Inject 5 mg into the skin once a week., Disp: 2 mL, Rfl: 2   tolterodine (DETROL) 2 MG tablet, Take 0.5 tablets (1 mg total) by mouth 2 (two) times daily., Disp: 90 tablet, Rfl: 1   Vitamin D, Ergocalciferol, (DRISDOL) 1.25 MG (50000 UNIT) CAPS capsule, Take 1 capsule (50,000 Units total) by mouth every 7 (seven) days., Disp: 12 capsule, Rfl: 2   amLODipine (NORVASC) 10 MG tablet, Take 1 tablet (10 mg total) by mouth daily.,  Disp: 30 tablet, Rfl: 2   atenolol (TENORMIN) 50 MG tablet, Take 1 tablet (50 mg total) by mouth daily., Disp: 90 tablet, Rfl: 1   atorvastatin (LIPITOR) 40 MG tablet, Take 1 tablet (40 mg total) by mouth daily., Disp: 90 tablet, Rfl: 1   hydrOXYzine (ATARAX) 10 MG tablet, Take 1 tablet (10 mg total) by mouth 3 (three) times daily as needed., Disp: 90 tablet, Rfl: 1   meloxicam (MOBIC) 7.5 MG tablet, Take 1 tablet (7.5 mg total) by mouth daily., Disp: 30 tablet, Rfl: 5   metFORMIN (GLUCOPHAGE-XR) 500 MG 24 hr tablet, Take 1 tablet (500 mg total) by mouth daily with breakfast., Disp: 90 tablet, Rfl: 1   No Known Allergies Past Medical History:  Diagnosis Date   Anxiety    Hypertension    Mixed hyperlipidemia 04/17/2019   Prediabetes 05/02/2020   Vitamin D deficiency 07/01/2017    History reviewed. No pertinent surgical history.  Social History   Socioeconomic History   Marital status: Divorced    Spouse name: Not on file   Number of children: Not on file   Years of education: Not on file   Highest education level: Not on file  Occupational History   Not on file  Tobacco Use   Smoking status: Every Day     Packs/day: 0.50    Years: 15.00    Pack years: 7.50    Types: Cigarettes   Smokeless tobacco: Never  Vaping Use   Vaping Use: Never used  Substance and Sexual Activity   Alcohol use: Never   Drug use: Never   Sexual activity: Not Currently  Other Topics Concern   Not on file  Social History Narrative   Not on file   Social Determinants of Health   Financial Resource Strain: Not on file  Food Insecurity: Not on file  Transportation Needs: Not on file  Physical Activity: Not on file  Stress: Not on file  Social Connections: Not on file  Intimate Partner Violence: Not on file        Objective:    BP (!) 148/89   Pulse 76   Temp 97.7 F (36.5 C) (Temporal)   Ht 5' 6" (1.676 m)   Wt 103.5 kg   SpO2 97%   BMI 36.83 kg/m   Wt Readings from Last 3 Encounters:  02/01/21 103.5 kg  10/19/20 104.3 kg  09/16/20 103.2 kg    Physical Exam Vitals reviewed.  Constitutional:      General: She is not in acute distress.    Appearance: Normal appearance. She is morbidly obese. She is not ill-appearing, toxic-appearing or diaphoretic.  HENT:     Head: Normocephalic and atraumatic.  Eyes:     General: No scleral icterus.       Right eye: No discharge.        Left eye: No discharge.     Conjunctiva/sclera: Conjunctivae normal.     Comments: Xanthelasmas present bilaterally.  Cardiovascular:     Rate and Rhythm: Normal rate and regular rhythm.     Heart sounds: Normal heart sounds. No murmur heard.   No friction rub. No gallop.  Pulmonary:     Effort: Pulmonary effort is normal. No respiratory distress.     Breath sounds: Normal breath sounds. No stridor. No wheezing, rhonchi or rales.  Musculoskeletal:        General: Normal range of motion.     Cervical back: Normal range of motion.  Feet:  Left foot:     Skin integrity: Callus and dry skin present. No ulcer, blister, skin breakdown, erythema or warmth.  Skin:    General: Skin is warm and dry.  Neurological:      Mental Status: She is alert and oriented to person, place, and time. Mental status is at baseline.  Psychiatric:        Mood and Affect: Mood normal.        Behavior: Behavior normal.        Thought Content: Thought content normal.        Judgment: Judgment normal.    Lab Results  Component Value Date   TSH 0.580 04/16/2019   Lab Results  Component Value Date   WBC 10.9 (H) 09/02/2020   HGB 14.4 09/02/2020   HCT 43.9 09/02/2020   MCV 90 09/02/2020   PLT 221 09/02/2020   Lab Results  Component Value Date   NA 140 09/02/2020   K 4.4 09/02/2020   CO2 21 09/02/2020   GLUCOSE 120 (H) 09/02/2020   BUN 14 09/02/2020   CREATININE 0.89 09/02/2020   BILITOT 0.3 09/02/2020   ALKPHOS 90 09/02/2020   AST 11 09/02/2020   ALT 15 09/02/2020   PROT 6.5 09/02/2020   ALBUMIN 4.0 09/02/2020   CALCIUM 9.0 09/02/2020   EGFR 80 09/02/2020   Lab Results  Component Value Date   CHOL 178 09/02/2020   Lab Results  Component Value Date   HDL 37 (L) 09/02/2020   Lab Results  Component Value Date   LDLCALC 125 (H) 09/02/2020   Lab Results  Component Value Date   TRIG 85 09/02/2020   Lab Results  Component Value Date   CHOLHDL 4.8 (H) 09/02/2020   Lab Results  Component Value Date   HGBA1C 6.2 09/02/2020

## 2021-02-02 LAB — CMP14+EGFR
ALT: 13 IU/L (ref 0–32)
AST: 11 IU/L (ref 0–40)
Albumin/Globulin Ratio: 1.7 (ref 1.2–2.2)
Albumin: 4 g/dL (ref 3.8–4.8)
Alkaline Phosphatase: 85 IU/L (ref 44–121)
BUN/Creatinine Ratio: 13 (ref 9–23)
BUN: 11 mg/dL (ref 6–24)
Bilirubin Total: 0.4 mg/dL (ref 0.0–1.2)
CO2: 24 mmol/L (ref 20–29)
Calcium: 9 mg/dL (ref 8.7–10.2)
Chloride: 103 mmol/L (ref 96–106)
Creatinine, Ser: 0.85 mg/dL (ref 0.57–1.00)
Globulin, Total: 2.4 g/dL (ref 1.5–4.5)
Glucose: 112 mg/dL — ABNORMAL HIGH (ref 70–99)
Potassium: 4.4 mmol/L (ref 3.5–5.2)
Sodium: 141 mmol/L (ref 134–144)
Total Protein: 6.4 g/dL (ref 6.0–8.5)
eGFR: 85 mL/min/{1.73_m2} (ref >59)

## 2021-02-02 LAB — CBC WITH DIFFERENTIAL/PLATELET
Basophils Absolute: 0 10*3/uL (ref 0.0–0.2)
Basos: 0 %
EOS (ABSOLUTE): 0.1 10*3/uL (ref 0.0–0.4)
Eos: 1 %
Hematocrit: 42.4 % (ref 34.0–46.6)
Hemoglobin: 14.4 g/dL (ref 11.1–15.9)
Immature Grans (Abs): 0 10*3/uL (ref 0.0–0.1)
Immature Granulocytes: 0 %
Lymphocytes Absolute: 4.1 10*3/uL — ABNORMAL HIGH (ref 0.7–3.1)
Lymphs: 44 %
MCH: 30.6 pg (ref 26.6–33.0)
MCHC: 34 g/dL (ref 31.5–35.7)
MCV: 90 fL (ref 79–97)
Monocytes Absolute: 0.6 10*3/uL (ref 0.1–0.9)
Monocytes: 7 %
Neutrophils Absolute: 4.4 10*3/uL (ref 1.4–7.0)
Neutrophils: 48 %
Platelets: 194 10*3/uL (ref 150–450)
RBC: 4.71 x10E6/uL (ref 3.77–5.28)
RDW: 12.3 % (ref 11.7–15.4)
WBC: 9.2 10*3/uL (ref 3.4–10.8)

## 2021-02-02 LAB — LIPID PANEL
Chol/HDL Ratio: 6.2 ratio — ABNORMAL HIGH (ref 0.0–4.4)
Cholesterol, Total: 210 mg/dL — ABNORMAL HIGH (ref 100–199)
HDL: 34 mg/dL — ABNORMAL LOW (ref >39)
LDL Chol Calc (NIH): 158 mg/dL — ABNORMAL HIGH (ref 0–99)
Triglycerides: 101 mg/dL (ref 0–149)
VLDL Cholesterol Cal: 18 mg/dL (ref 5–40)

## 2021-02-03 LAB — VITAMIN D 25 HYDROXY (VIT D DEFICIENCY, FRACTURES): Vit D, 25-Hydroxy: 25.3 ng/mL — ABNORMAL LOW (ref 30.0–100.0)

## 2021-02-03 LAB — SPECIMEN STATUS REPORT

## 2021-02-03 LAB — B12 AND FOLATE PANEL
Folate: 5.5 ng/mL (ref >3.0)
Vitamin B-12: 324 pg/mL (ref 232–1245)

## 2021-02-08 ENCOUNTER — Other Ambulatory Visit: Payer: Self-pay | Admitting: Family Medicine

## 2021-02-08 DIAGNOSIS — E559 Vitamin D deficiency, unspecified: Secondary | ICD-10-CM

## 2021-02-08 MED ORDER — VITAMIN D (ERGOCALCIFEROL) 1.25 MG (50000 UNIT) PO CAPS: 50000.0000 [IU] | ORAL_CAPSULE | ORAL | 2 refills | Status: DC

## 2021-02-22 ENCOUNTER — Other Ambulatory Visit: Payer: Self-pay | Admitting: Family Medicine

## 2021-02-22 DIAGNOSIS — E782 Mixed hyperlipidemia: Secondary | ICD-10-CM

## 2021-03-10 ENCOUNTER — Encounter: Payer: Self-pay | Admitting: Family Medicine

## 2021-03-14 ENCOUNTER — Encounter: Payer: Self-pay | Admitting: Nurse Practitioner

## 2021-03-14 ENCOUNTER — Ambulatory Visit (INDEPENDENT_AMBULATORY_CARE_PROVIDER_SITE_OTHER): Payer: Managed Care, Other (non HMO) | Admitting: Nurse Practitioner

## 2021-03-14 ENCOUNTER — Other Ambulatory Visit: Payer: Self-pay

## 2021-03-14 VITALS — BP 129/83 | HR 66 | Temp 97.1°F | Resp 20 | Ht 66.0 in | Wt 223.0 lb

## 2021-03-14 DIAGNOSIS — F419 Anxiety disorder, unspecified: Secondary | ICD-10-CM

## 2021-03-14 DIAGNOSIS — F331 Major depressive disorder, recurrent, moderate: Secondary | ICD-10-CM | POA: Diagnosis not present

## 2021-03-14 MED ORDER — FLUOXETINE HCL 40 MG PO CAPS: 40.0000 mg | ORAL_CAPSULE | Freq: Every day | ORAL | 3 refills | Status: DC

## 2021-03-14 MED ORDER — BUSPIRONE HCL 10 MG PO TABS: 10.0000 mg | ORAL_TABLET | Freq: Two times a day (BID) | ORAL | 1 refills | Status: DC

## 2021-03-14 NOTE — Progress Notes (Signed)
Subjective:    Patient ID: Rebecca Garner, adult    DOB: 22-Dec-1975, 46 y.o.   MRN: [id number]   Chief Complaint: panic attacks (Happens while driving. Patient is afraid to leave the house)   HPI Patient comes in today c/o panic attacks. She says when she leaves her house she gets very anxious and nervous. Feels flushed and heart starts racing. Can last 10-15 minutes. Happens daily especially when she is on her way to work or when she is by herself. These episodes started a few weeks ago. She denies any change in life events that could have started these. She is currently on prozac and atarax. Depression screen [facility] 2/9 03/14/2021 02/01/2021 09/16/2020  Decreased Interest 3 2 2   Down, Depressed, Hopeless 3 2 0  PHQ - 2 Score 6 4 2   Altered sleeping 3 2 2   Tired, decreased energy 3 2 3   Change in appetite 1 2 2   Feeling bad or failure about yourself  3 2 0  Trouble concentrating 2 2 0  Moving slowly or fidgety/restless 3 2 0  Suicidal thoughts - 2 0  PHQ-9 Score 21 18 9   Difficult doing work/chores - Somewhat difficult Somewhat difficult   GAD 7 : Generalized Anxiety Score 03/14/2021 02/01/2021 09/16/2020 07/19/2020  Nervous, Anxious, on Edge 3 2 3 2   Control/stop worrying 3 2 2 2   Worry too much - different things 3 2 2 2   Trouble relaxing 3 2 1 2   Restless 3 2 1 2   Easily annoyed or irritable 3 2 0 2  Afraid - awful might happen 3 2 2 2   Total GAD 7 Score 21 14 11 14   Anxiety Difficulty - Somewhat difficult Somewhat difficult Somewhat difficult        Review of Systems  Constitutional:  Negative for diaphoresis.  Eyes:  Negative for pain.  Respiratory:  Negative for shortness of breath.   Cardiovascular:  Negative for chest pain, palpitations and leg swelling.  Gastrointestinal:  Negative for abdominal pain.  Endocrine: Negative for polydipsia.  Skin:  Negative for rash.  Neurological:  Negative for dizziness, weakness and headaches.  Hematological:  Does not bruise/bleed  easily.  All other systems reviewed and are negative.     Objective:   Physical Exam Vitals and nursing note reviewed.  Constitutional:      General: She is not in acute distress.    Appearance: Normal appearance. She is well-developed.  Neck:     Vascular: No carotid bruit or JVD.  Cardiovascular:     Rate and Rhythm: Normal rate and regular rhythm.     Heart sounds: Normal heart sounds.  Pulmonary:     Effort: Pulmonary effort is normal. No respiratory distress.     Breath sounds: Normal breath sounds. No wheezing or rales.  Chest:     Chest wall: No tenderness.  Abdominal:     General: There is no abdominal bruit.     Palpations: There is no hepatomegaly, splenomegaly or pulsatile mass.  Musculoskeletal:        General: Normal range of motion.     Cervical back: Normal range of motion and neck supple.  Lymphadenopathy:     Cervical: No cervical adenopathy.  Skin:    General: Skin is warm and dry.  Neurological:     Mental Status: She is alert and oriented to person, place, and time.     Deep Tendon Reflexes: Reflexes are normal and symmetric.  Psychiatric:  Behavior: Behavior normal.        Thought Content: Thought content normal.        Judgment: Judgment normal.    BP 129/83    Pulse 66    Temp (!) 97.1 F (36.2 C) (Temporal)    Resp 20    Ht 5\' 6"  (1.676 m)    Wt 223 lb (101.2 kg)    SpO2 98%    BMI 35.99 kg/m        Assessment & Plan:   [name] in today with chief complaint of panic attacks (Happens while driving. Patient is afraid to leave the house)   1. Anxiety Deep breathing exercises Do not drive until under control - busPIRone (BUSPAR) 10 MG tablet; Take 1 tablet (10 mg total) by mouth 2 (two) times daily.  Dispense: 60 tablet; Refill: 1  2. Moderate recurrent major depression (HCC) Stress management - FLUoxetine (PROZAC) 40 MG capsule; Take 1 capsule (40 mg total) by mouth daily.  Dispense: 90 capsule; Refill: 3  Follow up with PCP  in 2 weeks  The above assessment and management plan was discussed with the patient. The patient verbalized understanding of and has agreed to the management plan. Patient is aware to call the clinic if symptoms persist or worsen. Patient is aware when to return to the clinic for a follow-up visit. Patient educated on when it is appropriate to go to the emergency department.   Mary-Margaret Hassell Done, FNP

## 2021-03-14 NOTE — Patient Instructions (Signed)

## 2021-03-15 ENCOUNTER — Ambulatory Visit: Payer: Managed Care, Other (non HMO) | Admitting: Family Medicine

## 2021-03-29 ENCOUNTER — Encounter: Payer: Self-pay | Admitting: Family Medicine

## 2021-03-31 ENCOUNTER — Ambulatory Visit: Payer: Managed Care, Other (non HMO) | Admitting: Family Medicine

## 2021-04-04 ENCOUNTER — Encounter: Payer: Self-pay | Admitting: Family Medicine

## 2021-04-14 ENCOUNTER — Ambulatory Visit: Payer: Managed Care, Other (non HMO) | Admitting: Family Medicine

## 2021-04-19 ENCOUNTER — Inpatient Hospital Stay (INDEPENDENT_AMBULATORY_CARE_PROVIDER_SITE_OTHER): Payer: Managed Care, Other (non HMO)

## 2021-04-19 ENCOUNTER — Ambulatory Visit (INDEPENDENT_AMBULATORY_CARE_PROVIDER_SITE_OTHER): Payer: Managed Care, Other (non HMO) | Admitting: Family Medicine

## 2021-04-19 VITALS — BP 111/70 | HR 70 | Temp 97.8°F | Ht 66.0 in | Wt 225.0 lb

## 2021-04-19 DIAGNOSIS — Z1211 Encounter for screening for malignant neoplasm of colon: Secondary | ICD-10-CM | POA: Diagnosis not present

## 2021-04-19 DIAGNOSIS — I459 Conduction disorder, unspecified: Secondary | ICD-10-CM | POA: Diagnosis not present

## 2021-04-19 DIAGNOSIS — R000 Tachycardia, unspecified: Secondary | ICD-10-CM | POA: Diagnosis not present

## 2021-04-19 DIAGNOSIS — F410 Panic disorder [episodic paroxysmal anxiety] without agoraphobia: Secondary | ICD-10-CM | POA: Diagnosis not present

## 2021-04-19 NOTE — Progress Notes (Signed)
° °Assessment & Plan:  °1. Panic attacks °Questioning if this is brought on by her tachycardia and skipping heartbeats.  We will work this up.  She will continue Prozac 40 mg once daily and BuSpar 10 mg twice daily since she feels she is improving.  Education provided on panic attacks. ° °2. Tachycardia °- CMP14+EGFR °- TSH °- LONG TERM MONITOR (3-14 DAYS); Future ° °3. Skipped heart beats °- LONG TERM MONITOR (3-14 DAYS); Future ° °4. Colon cancer screening °- Ambulatory referral to Gastroenterology ° ° °Return in about 3 weeks (around 05/10/2021) for Anxiety/Tachycardia. ° °[name], MSN, APRN, FNP-C °[facility] ° °Subjective:  ° ° Patient ID: Rebecca Garner, adult    DOB: 03/17/1975, 46 y.o.   MRN: [id number] ° °Patient Care Team: °Joyce, Britney F, FNP as PCP - General (Family Medicine) °Pruitt, Julie D, RPH as Pharmacist (Family Medicine)  ° °Chief Complaint:  °Chief Complaint  °Patient presents with  ° Follow-up  °  F/u panic attack, pt did went to ER. Don't know why having panic attack  ° ° °HPI: °Rebecca Garner is a 46 y.o. adult presenting on 04/19/2021 for Follow-up (F/u panic attack, pt did went to ER. Don't know why having panic attack) ° °Panic attacks: Patient was seen on 03/14/2021 in our office with complaints of panic attacks that started a few weeks prior.  At that time her Prozac was increased from 20 mg to 40 mg daily and she was started on buspirone 10 mg twice daily.  She was then seen at [facility] on 03/23/2021 with a panic attack.  She reports today that overall she is feeling much better, but has not been by herself as this is when the panic attacks occur.  She reports prior to the panic attack she feels her heart racing and skipping beats which scares her and then sends her into the panic attack.  She states even in the past week, although she has not had a panic attack, she has felt the heart racing and skipping. ° °GAD 7 : Generalized Anxiety Score 04/19/2021  03/14/2021 02/01/2021 09/16/2020  °Nervous, Anxious, on Edge 2 3 2 3  °Control/stop worrying 2 3 2 2  °Worry too much - different things 2 3 2 2  °Trouble relaxing 2 3 2 1  °Restless 2 3 2 1  °Easily annoyed or irritable 2 3 2 0  °Afraid - awful might happen 2 3 2 2  °Total GAD 7 Score 14 21 14 11  °Anxiety Difficulty Somewhat difficult - Somewhat difficult Somewhat difficult  ° °Depression screen PHQ 2/9 04/19/2021 03/14/2021 02/01/2021  °Decreased Interest 2 3 2  °Down, Depressed, Hopeless 2 3 2  °PHQ - 2 Score 4 6 4  °Altered sleeping 2 3 2  °Tired, decreased energy 2 3 2  °Change in appetite 2 1 2  °Feeling bad or failure about yourself  2 3 2  °Trouble concentrating 2 2 2  °Moving slowly or fidgety/restless 2 3 2  °Suicidal thoughts 2 - 2  °PHQ-9 Score 18 21 18  °Difficult doing work/chores Somewhat difficult - Somewhat difficult  °Some recent data might be hidden  ° ° °New complaints: °None ° ° °Social history: ° °Relevant past medical, surgical, family and social history reviewed and updated as indicated. Interim medical history since our last visit reviewed. ° °Allergies and medications reviewed and updated. ° °DATA REVIEWED: CHART IN EPIC ° °ROS: Negative unless specifically indicated above in HPI.  ° ° °  Current Outpatient Medications:    albuterol (VENTOLIN HFA) 108 (90 Base) MCG/ACT inhaler, Inhale 2 puffs into the lungs every 6 (six) hours as needed for wheezing or shortness of breath., Disp: 18 g, Rfl: 5   amLODipine (NORVASC) 10 MG tablet, Take 1 tablet (10 mg total) by mouth daily., Disp: 30 tablet, Rfl: 2   atenolol (TENORMIN) 50 MG tablet, Take 1 tablet (50 mg total) by mouth daily., Disp: 90 tablet, Rfl: 1   atorvastatin (LIPITOR) 40 MG tablet, TAKE 1 TABLET DAILY, Disp: 90 tablet, Rfl: 0   busPIRone (BUSPAR) 10 MG tablet, Take 1 tablet (10 mg total) by mouth 2 (two) times daily., Disp: 60 tablet, Rfl: 1   FLUoxetine (PROZAC) 40 MG capsule, Take 1 capsule (40 mg total) by mouth daily., Disp: 90  capsule, Rfl: 3   meloxicam (MOBIC) 7.5 MG tablet, Take 1 tablet (7.5 mg total) by mouth daily., Disp: 30 tablet, Rfl: 5   metFORMIN (GLUCOPHAGE-XR) 500 MG 24 hr tablet, Take 1 tablet (500 mg total) by mouth daily with breakfast., Disp: 90 tablet, Rfl: 1   tolterodine (DETROL) 2 MG tablet, Take 0.5 tablets (1 mg total) by mouth 2 (two) times daily., Disp: 90 tablet, Rfl: 1   Vitamin D, Ergocalciferol, (DRISDOL) 1.25 MG (50000 UNIT) CAPS capsule, Take 1 capsule (50,000 Units total) by mouth 2 (two) times a week., Disp: 24 capsule, Rfl: 2   tirzepatide (MOUNJARO) 5 MG/0.5ML Pen, Inject 5 mg into the skin once a week. (Patient not taking: Reported on 04/19/2021), Disp: 2 mL, Rfl: 2   No Known Allergies Past Medical History:  Diagnosis Date   Anxiety    Hypertension    Mixed hyperlipidemia 04/17/2019   Prediabetes 05/02/2020   Vitamin D deficiency 07/01/2017    No past surgical history on file.  Social History   Socioeconomic History   Marital status: Divorced    Spouse name: Not on file   Number of children: Not on file   Years of education: Not on file   Highest education level: Not on file  Occupational History   Not on file  Tobacco Use   Smoking status: Every Day    Packs/day: 0.50    Years: 15.00    Pack years: 7.50    Types: Cigarettes   Smokeless tobacco: Never  Vaping Use   Vaping Use: Never used  Substance and Sexual Activity   Alcohol use: Never   Drug use: Never   Sexual activity: Not Currently  Other Topics Concern   Not on file  Social History Narrative   Not on file   Social Determinants of Health   Financial Resource Strain: Not on file  Food Insecurity: Not on file  Transportation Needs: Not on file  Physical Activity: Not on file  Stress: Not on file  Social Connections: Not on file  Intimate Partner Violence: Not on file        Objective:    BP 111/70    Pulse 70    Temp 97.8 F (36.6 C) (Temporal)    Ht 5' 6" (1.676 m)    Wt 225 lb (102.1 kg)     SpO2 95%    BMI 36.32 kg/m   Wt Readings from Last 3 Encounters:  04/19/21 225 lb (102.1 kg)  03/14/21 223 lb (101.2 kg)  02/01/21 228 lb 3.2 oz (103.5 kg)    Physical Exam Vitals reviewed.  Constitutional:      General: She is not in acute distress.  Appearance: Normal appearance. She is obese. She is not ill-appearing, toxic-appearing or diaphoretic.  HENT:     Head: Normocephalic and atraumatic.  Eyes:     General: No scleral icterus.       Right eye: No discharge.        Left eye: No discharge.     Conjunctiva/sclera: Conjunctivae normal.  Cardiovascular:     Rate and Rhythm: Normal rate and regular rhythm.     Heart sounds: Normal heart sounds. No murmur heard.   No friction rub. No gallop.  Pulmonary:     Effort: Pulmonary effort is normal. No respiratory distress.     Breath sounds: Normal breath sounds. No stridor. No wheezing, rhonchi or rales.  Musculoskeletal:        General: Normal range of motion.     Cervical back: Normal range of motion.  Skin:    General: Skin is warm and dry.  Neurological:     Mental Status: She is alert and oriented to person, place, and time. Mental status is at baseline.  Psychiatric:        Mood and Affect: Mood normal.        Behavior: Behavior normal.        Thought Content: Thought content normal.        Judgment: Judgment normal.    Lab Results  Component Value Date   TSH 0.580 04/16/2019   Lab Results  Component Value Date   WBC 9.2 02/01/2021   HGB 14.4 02/01/2021   HCT 42.4 02/01/2021   MCV 90 02/01/2021   PLT 194 02/01/2021   Lab Results  Component Value Date   NA 141 02/01/2021   K 4.4 02/01/2021   CO2 24 02/01/2021   GLUCOSE 112 (H) 02/01/2021   BUN 11 02/01/2021   CREATININE 0.85 02/01/2021   BILITOT 0.4 02/01/2021   ALKPHOS 85 02/01/2021   AST 11 02/01/2021   ALT 13 02/01/2021   PROT 6.4 02/01/2021   ALBUMIN 4.0 02/01/2021   CALCIUM 9.0 02/01/2021   EGFR 85 02/01/2021   Lab Results   Component Value Date   CHOL 210 (H) 02/01/2021   Lab Results  Component Value Date   HDL 34 (L) 02/01/2021   Lab Results  Component Value Date   LDLCALC 158 (H) 02/01/2021   Lab Results  Component Value Date   TRIG 101 02/01/2021   Lab Results  Component Value Date   CHOLHDL 6.2 (H) 02/01/2021   Lab Results  Component Value Date   HGBA1C 6.1 (H) 02/01/2021

## 2021-04-20 ENCOUNTER — Telehealth: Payer: Self-pay | Admitting: Family Medicine

## 2021-04-20 LAB — CMP14+EGFR
ALT: 17 IU/L (ref 0–32)
AST: 14 IU/L (ref 0–40)
Albumin/Globulin Ratio: 1.5 (ref 1.2–2.2)
Albumin: 4.1 g/dL (ref 3.8–4.8)
Alkaline Phosphatase: 125 IU/L — ABNORMAL HIGH (ref 44–121)
BUN/Creatinine Ratio: 15 (ref 9–23)
BUN: 13 mg/dL (ref 6–24)
Bilirubin Total: <0.2 mg/dL (ref 0.0–1.2)
CO2: 22 mmol/L (ref 20–29)
Calcium: 9.3 mg/dL (ref 8.7–10.2)
Chloride: 103 mmol/L (ref 96–106)
Creatinine, Ser: 0.88 mg/dL (ref 0.57–1.00)
Globulin, Total: 2.7 g/dL (ref 1.5–4.5)
Glucose: 106 mg/dL — ABNORMAL HIGH (ref 70–99)
Potassium: 4.1 mmol/L (ref 3.5–5.2)
Sodium: 141 mmol/L (ref 134–144)
Total Protein: 6.8 g/dL (ref 6.0–8.5)
eGFR: 82 mL/min/{1.73_m2} (ref >59)

## 2021-04-20 LAB — TSH: TSH: 0.398 u[IU]/mL — ABNORMAL LOW (ref 0.450–4.500)

## 2021-04-20 NOTE — Telephone Encounter (Signed)
D/C Metformin. She can pick up a new prescription for [facility] whenever she wants. She stopped getting it because she was out of work and couldn't afford it. It was never D/C'd.

## 2021-04-20 NOTE — Telephone Encounter (Signed)
Patient aware and verbalizes understanding. 

## 2021-04-20 NOTE — Telephone Encounter (Signed)
Pt says that metformin causes diarrhea and wants something else called.  Pt wants to go back on [employer]jar[employer] Send to [employer]

## 2021-04-23 ENCOUNTER — Encounter: Payer: Self-pay | Admitting: Family Medicine

## 2021-04-25 ENCOUNTER — Other Ambulatory Visit: Payer: Self-pay

## 2021-04-25 ENCOUNTER — Encounter (INDEPENDENT_AMBULATORY_CARE_PROVIDER_SITE_OTHER): Payer: Self-pay | Admitting: *Deleted

## 2021-04-26 ENCOUNTER — Other Ambulatory Visit: Payer: Self-pay

## 2021-04-26 NOTE — Addendum Note (Signed)
Addended by: Magdalene River on: 04/26/2021 09:17 AM   Modules accepted: Orders

## 2021-04-29 ENCOUNTER — Encounter: Payer: Self-pay | Admitting: Family Medicine

## 2021-05-02 ENCOUNTER — Encounter: Payer: Self-pay | Admitting: Family Medicine

## 2021-05-02 NOTE — Telephone Encounter (Signed)
R/C to message sent 2-25.

## 2021-05-17 ENCOUNTER — Telehealth: Payer: Self-pay | Admitting: Family Medicine

## 2021-05-17 NOTE — Telephone Encounter (Signed)
Pt called wanting to know if PCP can change her anxiety medicine because she says her medicine is not helping her. Says she really needs medicine for the panic attacks she has. ? ?Pt also says she wants to be taken off of the Metformin and switched to something else because sugar keeps staying high. ?

## 2021-05-17 NOTE — Telephone Encounter (Signed)
Appointment scheduled.

## 2021-05-18 ENCOUNTER — Encounter: Payer: Self-pay | Admitting: Family Medicine

## 2021-05-18 ENCOUNTER — Ambulatory Visit (INDEPENDENT_AMBULATORY_CARE_PROVIDER_SITE_OTHER): Payer: Managed Care, Other (non HMO) | Admitting: Family Medicine

## 2021-05-18 DIAGNOSIS — F410 Panic disorder [episodic paroxysmal anxiety] without agoraphobia: Secondary | ICD-10-CM

## 2021-05-18 DIAGNOSIS — N3281 Overactive bladder: Secondary | ICD-10-CM

## 2021-05-18 DIAGNOSIS — I459 Conduction disorder, unspecified: Secondary | ICD-10-CM

## 2021-05-18 DIAGNOSIS — F419 Anxiety disorder, unspecified: Secondary | ICD-10-CM

## 2021-05-18 DIAGNOSIS — R7303 Prediabetes: Secondary | ICD-10-CM

## 2021-05-18 MED ORDER — PROPRANOLOL HCL 10 MG PO TABS: 10.0000 mg | ORAL_TABLET | Freq: Three times a day (TID) | ORAL | 2 refills | Status: DC | PRN

## 2021-05-18 MED ORDER — ESCITALOPRAM OXALATE 10 MG PO TABS: 10.0000 mg | ORAL_TABLET | Freq: Every day | ORAL | 2 refills | Status: DC

## 2021-05-18 NOTE — Progress Notes (Addendum)
? ?Virtual Visit via Telephone Note ? ?I connected with Rebecca Garner on 05/18/21 at 4:26 PM by telephone and verified that I am speaking with the correct person using two identifiers. Rebecca Garner is currently located at home and nobody is currently with her during this visit. The provider, Gwenlyn Fudge, FNP is located in their office at time of visit. ? ?I discussed the limitations, risks, security and privacy concerns of performing an evaluation and management service by telephone and the availability of in person appointments. I also discussed with the patient that there may be a patient responsible charge related to this service. The patient expressed understanding and agreed to proceed. ? ?Subjective: ?PCP: Gwenlyn Fudge, FNP ? ?Chief Complaint  ?Patient presents with  ? Anxiety  ? ?Patient feels she needs something for panic attacks. These just started approximately three months ago. She is currently taking Prozac and BuSpar which she does not feel have been helpful. She has worn a heart monitor x2 weeks due to reports of heart racing and skipping. She previously reports these symptoms occurred prior to the start of panic attacks and also occur outside of a panic attack.  ? ?She would like to stop taking her metformin due to diarrhea. She states her blood sugars have been elevated up to 300. She is urinating more than usual the past month. States it is so much she is having accidents. Her overactive bladder was previously controlled with Detrol 1 mg daily.  ? ? ?ROS: Per HPI ? ?Current Outpatient Medications:  ?  albuterol (VENTOLIN HFA) 108 (90 Base) MCG/ACT inhaler, Inhale 2 puffs into the lungs every 6 (six) hours as needed for wheezing or shortness of breath., Disp: 18 g, Rfl: 5 ?  amLODipine (NORVASC) 10 MG tablet, Take 1 tablet (10 mg total) by mouth daily., Disp: 30 tablet, Rfl: 2 ?  atenolol (TENORMIN) 50 MG tablet, Take 1 tablet (50 mg total) by mouth daily., Disp: 90 tablet, Rfl: 1 ?   atorvastatin (LIPITOR) 40 MG tablet, TAKE 1 TABLET DAILY, Disp: 90 tablet, Rfl: 0 ?  busPIRone (BUSPAR) 10 MG tablet, Take 1 tablet (10 mg total) by mouth 2 (two) times daily., Disp: 60 tablet, Rfl: 1 ?  FLUoxetine (PROZAC) 40 MG capsule, Take 1 capsule (40 mg total) by mouth daily., Disp: 90 capsule, Rfl: 3 ?  meloxicam (MOBIC) 7.5 MG tablet, Take 1 tablet (7.5 mg total) by mouth daily., Disp: 30 tablet, Rfl: 5 ?  metFORMIN (GLUCOPHAGE-XR) 500 MG 24 hr tablet, Take 1 tablet (500 mg total) by mouth daily with breakfast., Disp: 90 tablet, Rfl: 1 ?  tirzepatide (MOUNJARO) 5 MG/0.5ML Pen, Inject 5 mg into the skin once a week. (Patient not taking: Reported on 04/19/2021), Disp: 2 mL, Rfl: 2 ?  tolterodine (DETROL) 2 MG tablet, Take 0.5 tablets (1 mg total) by mouth 2 (two) times daily., Disp: 90 tablet, Rfl: 1 ?  Vitamin D, Ergocalciferol, (DRISDOL) 1.25 MG (50000 UNIT) CAPS capsule, Take 1 capsule (50,000 Units total) by mouth 2 (two) times a week., Disp: 24 capsule, Rfl: 2 ? ?No Known Allergies ?Past Medical History:  ?Diagnosis Date  ? Anxiety   ? Hypertension   ? Mixed hyperlipidemia 04/17/2019  ? Prediabetes 05/02/2020  ? Vitamin D deficiency 07/01/2017  ? ? ?Observations/Objective: ?A&O  ?No respiratory distress or wheezing audible over the phone ?Mood, judgement, and thought processes all WNL ? ?Assessment and Plan: ?1. Anxiety ?Uncontrolled. D/C Prozac. Start Lexapro. ?- escitalopram (LEXAPRO) 10 MG  tablet; Take 1 tablet (10 mg total) by mouth daily.  Dispense: 30 tablet; Refill: 2 ? ?2. Panic attacks ?Started propranolol as needed. ?- propranolol (INDERAL) 10 MG tablet; Take 1 tablet (10 mg total) by mouth 3 (three) times daily as needed (panic attacks).  Dispense: 30 tablet; Refill: 2 ? ?3. Prediabetes ?- Bayer DCA Hb A1c Waived; Future ? ?4. Overactive bladder ?Discussed increasing urination may be related to diabetes if A1c has increased. If A1c is normal I will increase Detrol from 1 mg to 2 mg daily.  ? ?5.  Skipped heart beats ?Discussed heart monitor patch results with patient. No sustained arrhythmia. The one patient triggered event was during a panic attack her per report.  ? ? ?Follow Up Instructions: ?Return as directed after A1c results. ? ?I discussed the assessment and treatment plan with the patient. The patient was provided an opportunity to ask questions and all were answered. The patient agreed with the plan and demonstrated an understanding of the instructions. ?  ?The patient was advised to call back or seek an in-person evaluation if the symptoms worsen or if the condition fails to improve as anticipated. ? ?The above assessment and management plan was discussed with the patient. The patient verbalized understanding of and has agreed to the management plan. Patient is aware to call the clinic if symptoms persist or worsen. Patient is aware when to return to the clinic for a follow-up visit. Patient educated on when it is appropriate to go to the emergency department.  ? ?Time call ended: 4:37 PM ? ?I provided 11 minutes of non-face-to-face time during this encounter. ? ?Deliah Boston, MSN, APRN, FNP-C ?[facility] ?05/18/21 ? ? ?

## 2021-05-22 ENCOUNTER — Other Ambulatory Visit: Payer: Managed Care, Other (non HMO)

## 2021-05-22 DIAGNOSIS — R7303 Prediabetes: Secondary | ICD-10-CM

## 2021-05-22 LAB — BAYER DCA HB A1C WAIVED: HB A1C (BAYER DCA - WAIVED): 6.3 % — ABNORMAL HIGH (ref 4.8–5.6)

## 2021-05-23 ENCOUNTER — Other Ambulatory Visit: Payer: Self-pay | Admitting: Family Medicine

## 2021-05-23 DIAGNOSIS — N3281 Overactive bladder: Secondary | ICD-10-CM

## 2021-05-23 DIAGNOSIS — E782 Mixed hyperlipidemia: Secondary | ICD-10-CM

## 2021-05-23 MED ORDER — TOLTERODINE TARTRATE 2 MG PO TABS: 2.0000 mg | ORAL_TABLET | Freq: Two times a day (BID) | ORAL | 1 refills | Status: DC

## 2021-06-15 ENCOUNTER — Ambulatory Visit (INDEPENDENT_AMBULATORY_CARE_PROVIDER_SITE_OTHER): Payer: Managed Care, Other (non HMO) | Admitting: Family Medicine

## 2021-06-15 ENCOUNTER — Encounter: Payer: Self-pay | Admitting: Family Medicine

## 2021-06-15 VITALS — BP 116/76 | HR 73 | Temp 97.9°F | Ht 66.0 in | Wt 235.8 lb

## 2021-06-15 DIAGNOSIS — E559 Vitamin D deficiency, unspecified: Secondary | ICD-10-CM

## 2021-06-15 DIAGNOSIS — M25561 Pain in right knee: Secondary | ICD-10-CM

## 2021-06-15 MED ORDER — MELOXICAM 15 MG PO TABS: 15.0000 mg | ORAL_TABLET | Freq: Every day | ORAL | 2 refills | Status: AC

## 2021-06-15 NOTE — Progress Notes (Signed)
? ?Assessment & Plan:  ?1. Acute pain of right knee ?Meloxicam increased from 7.5 mg to 15 mg daily.  ?- Uric acid ?- meloxicam (MOBIC) 15 MG tablet; Take 1 tablet (15 mg total) by mouth daily.  Dispense: 30 tablet; Refill: 2 ?- Ambulatory referral to Orthopedic Surgery ? ?2. Vitamin D deficiency ?- VITAMIN D 25 Hydroxy (Vit-D Deficiency, Fractures) ? ? ?Return if symptoms worsen or fail to improve. ? ?Hendricks Limes, MSN, APRN, FNP-C ?[place] ? ?Subjective:  ? ? Patient ID: Rebecca Garner, adult    DOB: [birth year], 46 y.o.   MRN: [id number] ? ?Patient Care Team: ?Loman Brooklyn, FNP as PCP - General (Family Medicine) ?Lavera Guise, [facility] as Pharmacist (Family Medicine)  ? ?Chief Complaint:  ?Chief Complaint  ?Patient presents with  ? FMLA paper work  ?  Gout in right knee. States she has pain in it when she walks  ? ? ?HPI: ?Rebecca Garner is a 46 y.o. adult presenting on 06/15/2021 for FMLA paper work (Gout in right knee. States she has pain in it when she walks) ? ?Patient was seen at urgent care a little over two weeks ago for right knee pain that had been present for a week prior. Patient denies any known injury. She was given Toradol, Decadron and Tylenol. An appointment with the orthopedic was made for the very next day. She was given a prescription for tizanidine to take as needed. ? ?She was then seen in the ER on 06/07/2021 for continued right knee pain. Knee x-ray was unremarkable. She was discharged with a diagnosis of knee sprain and told to take Tylenol or Motrin as needed. ? ?She reports today she never went to the orthopedic as she forgot about the appointment and then did not know where she was suppose to be going, so she couldn't call to reschedule. She went back to work yesterday as she reports the pain has improved some. States initially her knee was very swollen and red, which has also improved. She is questioning if this is gout.  ? ? ?Social history: ? ?Relevant past  medical, surgical, family and social history reviewed and updated as indicated. Interim medical history since our last visit reviewed. ? ?Allergies and medications reviewed and updated. ? ?DATA REVIEWED: CHART IN EPIC ? ?ROS: Negative unless specifically indicated above in HPI.  ? ? ?Current Outpatient Medications:  ?  albuterol (VENTOLIN HFA) 108 (90 Base) MCG/ACT inhaler, Inhale 2 puffs into the lungs every 6 (six) hours as needed for wheezing or shortness of breath., Disp: 18 g, Rfl: 5 ?  amLODipine (NORVASC) 10 MG tablet, Take 1 tablet (10 mg total) by mouth daily., Disp: 30 tablet, Rfl: 2 ?  atenolol (TENORMIN) 50 MG tablet, Take 1 tablet (50 mg total) by mouth daily., Disp: 90 tablet, Rfl: 1 ?  atorvastatin (LIPITOR) 40 MG tablet, TAKE 1 TABLET DAILY, Disp: 90 tablet, Rfl: 0 ?  busPIRone (BUSPAR) 10 MG tablet, Take 1 tablet (10 mg total) by mouth 2 (two) times daily., Disp: 60 tablet, Rfl: 1 ?  escitalopram (LEXAPRO) 10 MG tablet, Take 1 tablet (10 mg total) by mouth daily., Disp: 30 tablet, Rfl: 2 ?  meloxicam (MOBIC) 7.5 MG tablet, Take 1 tablet (7.5 mg total) by mouth daily., Disp: 30 tablet, Rfl: 5 ?  propranolol (INDERAL) 10 MG tablet, Take 1 tablet (10 mg total) by mouth 3 (three) times daily as needed (panic attacks)., Disp: 30 tablet, Rfl: 2 ?  tolterodine (  DETROL) 2 MG tablet, Take 1 tablet (2 mg total) by mouth 2 (two) times daily., Disp: 180 tablet, Rfl: 1 ?  Vitamin D, Ergocalciferol, (DRISDOL) 1.25 MG (50000 UNIT) CAPS capsule, Take 1 capsule (50,000 Units total) by mouth 2 (two) times a week., Disp: 24 capsule, Rfl: 2  ? ?No Known Allergies ?Past Medical History:  ?Diagnosis Date  ? Anxiety   ? Hypertension   ? Mixed hyperlipidemia 04/17/2019  ? Prediabetes 05/02/2020  ? Vitamin D deficiency 07/01/2017  ?  ?History reviewed. No pertinent surgical history.  ?Social History  ? ?Socioeconomic History  ? Marital status: Divorced  ?  Spouse name: Not on file  ? Number of children: Not on file  ? Years  of education: Not on file  ? Highest education level: Not on file  ?Occupational History  ? Not on file  ?Tobacco Use  ? Smoking status: Every Day  ?  Packs/day: 0.50  ?  Years: 15.00  ?  Pack years: 7.50  ?  Types: Cigarettes  ? Smokeless tobacco: Never  ?Vaping Use  ? Vaping Use: Never used  ?Substance and Sexual Activity  ? Alcohol use: Never  ? Drug use: Never  ? Sexual activity: Not Currently  ?Other Topics Concern  ? Not on file  ?Social History Narrative  ? Not on file  ? ?Social Determinants of Health  ? ?Financial Resource Strain: Not on file  ?Food Insecurity: Not on file  ?Transportation Needs: Not on file  ?Physical Activity: Not on file  ?Stress: Not on file  ?Social Connections: Not on file  ?Intimate Partner Violence: Not on file  ?  ? ?   ?Objective:  ?  ?BP 116/76   Pulse 73   Temp 97.9 ?F (36.6 ?C) (Temporal)   Ht _0  (1.676 m)   Wt 235 lb 12.8 oz (107 kg)   SpO2 92%   BMI 38.06 kg/m?  ? ?Wt Readings from Last 3 Encounters:  ?06/15/21 235 lb 12.8 oz (107 kg)  ?04/19/21 225 lb (102.1 kg)  ?03/14/21 223 lb (101.2 kg)  ? ? ?Physical Exam ?Vitals reviewed.  ?Constitutional:   ?   General: She is not in acute distress. ?   Appearance: Normal appearance. She is not ill-appearing, toxic-appearing or diaphoretic.  ?HENT:  ?   Head: Normocephalic and atraumatic.  ?Eyes:  ?   General: No scleral icterus.    ?   Right eye: No discharge.     ?   Left eye: No discharge.  ?   Conjunctiva/sclera: Conjunctivae normal.  ?Cardiovascular:  ?   Rate and Rhythm: Normal rate.  ?Pulmonary:  ?   Effort: Pulmonary effort is normal. No respiratory distress.  ?Musculoskeletal:     ?   General: Normal range of motion.  ?   Cervical back: Normal range of motion.  ?   Right knee: Swelling present. No deformity, erythema, ecchymosis or lacerations. Tenderness (unable to properly assess as patient kept pushing my hands off her and asked me to stop) present.  ?Skin: ?   General: Skin is warm and dry.  ?Neurological:  ?    Mental Status: She is alert and oriented to person, place, and time. Mental status is at baseline.  ?Psychiatric:     ?   Mood and Affect: Mood normal.     ?   Behavior: Behavior normal.     ?   Thought Content: Thought content normal.     ?  Judgment: Judgment normal.  ? ? ?Lab Results  ?Component Value Date  ? TSH 0.398 (L) 04/19/2021  ? ?Lab Results  ?Component Value Date  ? WBC 9.2 02/01/2021  ? HGB 14.4 02/01/2021  ? HCT 42.4 02/01/2021  ? MCV 90 02/01/2021  ? PLT 194 02/01/2021  ? ?Lab Results  ?Component Value Date  ? NA 141 04/19/2021  ? K 4.1 04/19/2021  ? CO2 22 04/19/2021  ? GLUCOSE 106 (H) 04/19/2021  ? BUN 13 04/19/2021  ? CREATININE 0.88 04/19/2021  ? BILITOT <0.2 04/19/2021  ? ALKPHOS 125 (H) 04/19/2021  ? AST 14 04/19/2021  ? ALT 17 04/19/2021  ? PROT 6.8 04/19/2021  ? ALBUMIN 4.1 04/19/2021  ? CALCIUM 9.3 04/19/2021  ? EGFR 82 04/19/2021  ? ?Lab Results  ?Component Value Date  ? CHOL 210 (H) 02/01/2021  ? ?Lab Results  ?Component Value Date  ? HDL 34 (L) 02/01/2021  ? ?Lab Results  ?Component Value Date  ? [place] 158 (H) 02/01/2021  ? ?Lab Results  ?Component Value Date  ? TRIG 101 02/01/2021  ? ?Lab Results  ?Component Value Date  ? CHOLHDL 6.2 (H) 02/01/2021  ? ?Lab Results  ?Component Value Date  ? HGBA1C 6.3 (H) 05/22/2021  ? ? ?   ? ? ? ? ?

## 2021-06-16 LAB — VITAMIN D 25 HYDROXY (VIT D DEFICIENCY, FRACTURES): Vit D, 25-Hydroxy: 48.8 ng/mL (ref 30.0–100.0)

## 2021-06-16 LAB — URIC ACID: Uric Acid: 5.2 mg/dL (ref 2.6–6.2)

## 2021-06-18 ENCOUNTER — Encounter: Payer: Self-pay | Admitting: Family Medicine

## 2021-06-29 ENCOUNTER — Ambulatory Visit (INDEPENDENT_AMBULATORY_CARE_PROVIDER_SITE_OTHER): Payer: Managed Care, Other (non HMO) | Admitting: Orthopaedic Surgery

## 2021-06-29 ENCOUNTER — Encounter: Payer: Self-pay | Admitting: Orthopaedic Surgery

## 2021-06-29 VITALS — Ht 66.0 in | Wt 235.0 lb

## 2021-06-29 DIAGNOSIS — M65961 Unspecified synovitis and tenosynovitis, right lower leg: Secondary | ICD-10-CM

## 2021-06-29 DIAGNOSIS — M659 Synovitis and tenosynovitis, unspecified: Secondary | ICD-10-CM

## 2021-06-29 MED ORDER — METHYLPREDNISOLONE ACETATE 40 MG/ML IJ SUSP
40.0000 mg | INTRAMUSCULAR | Status: AC | PRN
  Administered 2021-06-29: 40 mg via INTRA_ARTICULAR

## 2021-06-29 MED ORDER — LIDOCAINE HCL 1 % IJ SOLN
0.5000 mL | INTRAMUSCULAR | Status: AC | PRN
  Administered 2021-06-29: .5 mL

## 2021-06-29 MED ORDER — BUPIVACAINE HCL 0.25 % IJ SOLN
4.0000 mL | INTRAMUSCULAR | Status: AC | PRN
  Administered 2021-06-29: 4 mL via INTRA_ARTICULAR

## 2021-06-29 NOTE — Progress Notes (Signed)
? ?Office Visit Note ?  ?Patient: Rebecca Garner           ?Date of Birth: 03-Oct-1975           ?MRN: [id number] ?Visit Date: 06/29/2021 ?             ?Requested by: Gwenlyn Fudge, FNP ?[address] [place],  [place] [address] ?PCP: Gwenlyn Fudge, FNP ? ? ?Assessment & Plan: ?Visit Diagnoses:  ?1. Synovitis of right knee   ? ? ?Plan: Intra-articular injection performed to anterolateral portal right knee which she tolerated fairly well.  She can resume work activity on Monday, 07/03/2021.  She will return if she is having persistent problems in a few weeks. ? ?Follow-Up Instructions: No follow-ups on file.  ? ?Orders:  ?Orders Placed This Encounter  ?Procedures  ? Large Joint Inj: R knee  ? ?No orders of the defined types were placed in this encounter. ? ? ? ? Procedures: ?Large Joint Inj: R knee on 06/29/2021 11:29 AM ?Indications: pain and joint swelling ?Details: 22 G 1.5 in needle, anterolateral approach ? ?Arthrogram: No ? ?Medications: 40 mg methylPREDNISolone acetate 40 MG/ML; 0.5 mL lidocaine 1 %; 4 mL bupivacaine 0.25 % ?Outcome: tolerated well, no immediate complications ?Procedure, treatment alternatives, risks and benefits explained, specific risks discussed. Consent was given by the patient. Immediately prior to procedure a time out was called to verify the correct patient, procedure, equipment, support staff and site/side marked as required. Patient was prepped and draped in the usual sterile fashion.  ? ? ? ? ?Clinical Data: ?No additional findings. ? ? ?Subjective: ?Chief Complaint  ?Patient presents with  ? Right Knee - Pain  ? ? ?HPI 46 year old female seen with severe right knee pain medially.  She states she has been on FMLA and been out of work for about 2 weeks.  She is taken meloxicam has had injection IM of prednisone which helped for a few days.  She states her knee has been popping catching and has pain along the medial knee.  She states is exactly like the problems she had in 2021 however  chart review the shows this was the opposite left knee.  She states that time she has had swelling in her ankle but it resolves.  She denies locking no injury.  Patient does have some hypertension anxiety, BMI 37.  Previous history left knee had therapy anti-inflammatories and she never did proceed with MRI scan.  Patient is amatory has right knee limp. ? ?Review of Systems all the systems noncontributory to HPI. ? ? ?Objective: ?Vital Signs: Ht 5\' 6"  (1.676 m)   Wt 235 lb (106.6 kg)   BMI 37.93 kg/m?  ? ?Physical Exam ?Constitutional:   ?   Appearance: She is well-developed.  ?HENT:  ?   Head: Normocephalic.  ?   Right Ear: External ear normal.  ?   Left Ear: External ear normal. There is no impacted cerumen.  ?Eyes:  ?   Pupils: Pupils are equal, round, and reactive to light.  ?Neck:  ?   Thyroid: No thyromegaly.  ?   Trachea: No tracheal deviation.  ?Cardiovascular:  ?   Rate and Rhythm: Normal rate.  ?Pulmonary:  ?   Effort: Pulmonary effort is normal.  ?Abdominal:  ?   Palpations: Abdomen is soft.  ?Musculoskeletal:  ?   Cervical back: No rigidity.  ?Skin: ?   General: Skin is warm and dry.  ?Neurological:  ?   Mental Status:  She is alert and oriented to person, place, and time.  ?Psychiatric:     ?   Behavior: Behavior normal.  ? ? ?Ortho Exam patient has some medial joint line tenderness tenderness adjacent to the patella no palpable plica.  Slight prominence of the prepatellar bursa on the right knee versus left.  Negative logroll of the hips when she finally relaxes.  Anterior tib gastrocsoleus is intact negative popliteal compression test.  Negative patellar subluxation.  Quad tendon patellar tendon is normal.  Mild thickening right knee prepatellar bursa and slight tenderness.  Small palpable medial plica not particularly thickened. ? ?Specialty Comments:  ?No specialty comments available. ? ?Imaging: ?Recent x-rays of her knee were negative for acute fracture no significant degenerative changes right  knee. ? ? ?PMFS History: ?Patient Active Problem List  ? Diagnosis Date Noted  ? Synovitis of right knee 06/29/2021  ? Moderate recurrent major depression (HCC) 02/01/2021  ? OSA (obstructive sleep apnea) 10/19/2020  ? Morbid obesity (HCC) 07/25/2020  ? Prediabetes 05/02/2020  ? Overactive bladder 02/01/2020  ? Pain in left knee 12/24/2019  ? Mixed hyperlipidemia 04/17/2019  ? Vitamin D deficiency 07/01/2017  ? Anxiety 05/16/2017  ? Essential (primary) hypertension 05/16/2017  ? ?Past Medical History:  ?Diagnosis Date  ? Anxiety   ? Hypertension   ? Mixed hyperlipidemia 04/17/2019  ? Prediabetes 05/02/2020  ? Vitamin D deficiency 07/01/2017  ?  ?Family History  ?Problem Relation Age of Onset  ? Diabetes Mother   ? Hypertension Mother   ? Hypertension Sister   ?  ?History reviewed. No pertinent surgical history. ?Social History  ? ?Occupational History  ? Not on file  ?Tobacco Use  ? Smoking status: Every Day  ?  Packs/day: 0.50  ?  Years: 15.00  ?  Pack years: 7.50  ?  Types: Cigarettes  ? Smokeless tobacco: Never  ?Vaping Use  ? Vaping Use: Never used  ?Substance and Sexual Activity  ? Alcohol use: Never  ? Drug use: Never  ? Sexual activity: Not Currently  ? ? ? ? ? ? ?

## 2021-07-03 ENCOUNTER — Telehealth: Payer: Self-pay | Admitting: Radiology

## 2021-07-03 NOTE — Telephone Encounter (Signed)
Message left for patient by Carlon in [place] office. ?

## 2021-07-03 NOTE — Telephone Encounter (Signed)
Please see message from [place] office below and advise. Would you like to order MRI? Patient is status post right knee injection 06/29/2021. ? ?[place]um[place], Carlon Cori Razor, Numa, RT; [place], Chili, RT ?Pt called and stated Dr Ophelia Charter said if she had continued pain he would proceed with an MRI. She wants to do this.  ?Jeani Hawking, no metal,prefers mornings, Rt knee  and best number to be reached is [phone]  ? ? ?I'll be happy to do this is someone will enter the order  ? ?Thanks!  ?

## 2021-07-11 ENCOUNTER — Emergency Department (HOSPITAL_COMMUNITY)
Admission: EM | Admit: 2021-07-11 | Discharge: 2021-07-11 | Disposition: A | Discharge status: Home / Self Care | Payer: Managed Care, Other (non HMO) | Attending: Emergency Medicine | Admitting: Emergency Medicine

## 2021-07-11 ENCOUNTER — Emergency Department (HOSPITAL_COMMUNITY): Payer: Managed Care, Other (non HMO)

## 2021-07-11 ENCOUNTER — Other Ambulatory Visit: Payer: Self-pay

## 2021-07-11 ENCOUNTER — Encounter (HOSPITAL_COMMUNITY): Payer: Self-pay | Admitting: Emergency Medicine

## 2021-07-11 DIAGNOSIS — Z79899 Other long term (current) drug therapy: Secondary | ICD-10-CM | POA: Insufficient documentation

## 2021-07-11 DIAGNOSIS — R600 Localized edema: Secondary | ICD-10-CM | POA: Insufficient documentation

## 2021-07-11 DIAGNOSIS — I10 Essential (primary) hypertension: Secondary | ICD-10-CM | POA: Insufficient documentation

## 2021-07-11 DIAGNOSIS — M25561 Pain in right knee: Secondary | ICD-10-CM | POA: Insufficient documentation

## 2021-07-11 LAB — CBC WITH DIFFERENTIAL/PLATELET
Abs Immature Granulocytes: 0.02 10*3/uL (ref 0.00–0.07)
Basophils Absolute: 0 10*3/uL (ref 0.0–0.1)
Basophils Relative: 0 %
Eosinophils Absolute: 0.1 10*3/uL (ref 0.0–0.5)
Eosinophils Relative: 1 %
HCT: 42.3 % (ref 36.0–46.0)
Hemoglobin: 13.6 g/dL (ref 12.0–15.0)
Immature Granulocytes: 0 %
Lymphocytes Relative: 38 %
Lymphs Abs: 3.2 10*3/uL (ref 0.7–4.0)
MCH: 30.2 pg (ref 26.0–34.0)
MCHC: 32.2 g/dL (ref 30.0–36.0)
MCV: 94 fL (ref 80.0–100.0)
Monocytes Absolute: 0.5 10*3/uL (ref 0.1–1.0)
Monocytes Relative: 6 %
Neutro Abs: 4.6 10*3/uL (ref 1.7–7.7)
Neutrophils Relative %: 55 %
Platelets: 202 10*3/uL (ref 150–400)
RBC: 4.5 MIL/uL (ref 3.87–5.11)
RDW: 13.2 % (ref 11.5–15.5)
WBC: 8.5 10*3/uL (ref 4.0–10.5)
nRBC: 0 % (ref 0.0–0.2)

## 2021-07-11 LAB — BASIC METABOLIC PANEL
Anion gap: 5 (ref 5–15)
BUN: 11 mg/dL (ref 6–20)
CO2: 28 mmol/L (ref 22–32)
Calcium: 9.1 mg/dL (ref 8.9–10.3)
Chloride: 109 mmol/L (ref 98–111)
Creatinine, Ser: 0.79 mg/dL (ref 0.44–1.00)
GFR, Estimated: >60 mL/min (ref >60)
Glucose, Bld: 131 mg/dL — ABNORMAL HIGH (ref 70–99)
Potassium: 3.9 mmol/L (ref 3.5–5.1)
Sodium: 142 mmol/L (ref 135–145)

## 2021-07-11 LAB — C-REACTIVE PROTEIN: CRP: 1.3 mg/dL — ABNORMAL HIGH (ref <1.0)

## 2021-07-11 LAB — SEDIMENTATION RATE: Sed Rate: 17 mm/hr (ref 0–22)

## 2021-07-11 MED ORDER — PREDNISONE 10 MG PO TABS: 20.0000 mg | ORAL_TABLET | Freq: Every day | ORAL | 0 refills | Status: DC

## 2021-07-11 NOTE — ED Triage Notes (Signed)
Pt having right knee pain x 3 weeks, has had 3 x-rays done at other ED, would like to be evaluated for fluid on knee and possible aspiration. ?

## 2021-07-11 NOTE — ED Provider Notes (Signed)
?[facility] ?Provider Note ? ? ?CSN: [id number] ?Arrival date & time: 07/11/21  1233 ? ?  ? ?History ? ?Chief Complaint  ?Patient presents with  ? Knee Pain  ? ? ?Rebecca Garner is a 46 y.o. adult. ? ? ?Knee Pain ? ?Patient with medical history notable for synovitis of right knee, BMI 35, hypertension, hyperlipidemia, anxiety, prediabetes presents today due to right knee/right lower extremity pain.  Started atraumatically 3 weeks ago, the pain has been intermittent and worse with any ambulation.  States she feels like the right leg is swollen compared to the left involving the calf.  She has tenderness behind the knee as well as anterior to the knee.  She has been seen in urgent care for this previously and had 3 x-rays which is all been unremarkable.  Was seen by orthopedics 07/03/2021 and had a joint injection which did help alleviate the pain somewhat.  She denies any chest pain or shortness of breath, no recent travel or surgeries.  She is a daily cigarette smoker.  Not on oral birth control. ? ?Home Medications ?Prior to Admission medications   ?Medication Sig Start Date End Date Taking? Authorizing Provider  ?predniSONE (DELTASONE) 10 MG tablet Take 2 tablets (20 mg total) by mouth daily. 07/11/21  Yes Sherrill Raring, PA-C  ?albuterol (VENTOLIN HFA) 108 (90 Base) MCG/ACT inhaler Inhale 2 puffs into the lungs every 6 (six) hours as needed for wheezing or shortness of breath. 04/29/20   Loman Brooklyn, FNP  ?amLODipine (NORVASC) 10 MG tablet Take 1 tablet (10 mg total) by mouth daily. 02/01/21   Loman [place], FNP  ?atenolol (TENORMIN) 50 MG tablet Take 1 tablet (50 mg total) by mouth daily. 02/01/21   Loman Brooklyn, FNP  ?atorvastatin (LIPITOR) 40 MG tablet TAKE 1 TABLET DAILY 05/23/21   Loman Brooklyn, FNP  ?busPIRone (BUSPAR) 10 MG tablet Take 1 tablet (10 mg total) by mouth 2 (two) times daily. 03/14/21   Chevis Pretty, FNP  ?escitalopram (LEXAPRO) 10 MG tablet Take 1 tablet (10 mg  total) by mouth daily. 05/18/21   Loman Brooklyn, FNP  ?meloxicam (MOBIC) 15 MG tablet Take 1 tablet (15 mg total) by mouth daily. 06/15/21   Loman Brooklyn, FNP  ?propranolol (INDERAL) 10 MG tablet Take 1 tablet (10 mg total) by mouth 3 (three) times daily as needed (panic attacks). 05/18/21   Loman Brooklyn, FNP  ?tolterodine (DETROL) 2 MG tablet Take 1 tablet (2 mg total) by mouth 2 (two) times daily. 05/23/21   Loman Brooklyn, FNP  ?Vitamin D, Ergocalciferol, (DRISDOL) 1.25 MG (50000 UNIT) CAPS capsule Take 1 capsule (50,000 Units total) by mouth 2 (two) times a week. 02/09/21   Loman Brooklyn, FNP  ?   ? ?Allergies    ?Patient has no known allergies.   ? ?Review of Systems   ?Review of Systems ? ?Physical Exam ?Updated Vital Signs ?BP 136/80 (BP Location: Right Arm)   Pulse 80   Temp 97.9 ?F (36.6 ?C) (Oral)   Resp 16   Ht 5' 6"  (1.676 m)   Wt 102.1 kg   SpO2 100%   BMI 36.32 kg/m?  ?Physical Exam ?Vitals and nursing note reviewed. Exam conducted with a chaperone present.  ?Constitutional:   ?   General: She is not in acute distress. ?   Appearance: Normal appearance.  ?HENT:  ?   Head: Normocephalic and atraumatic.  ?Eyes:  ?   General: No  scleral icterus. ?   Extraocular Movements: Extraocular movements intact.  ?   Pupils: Pupils are equal, round, and reactive to light.  ?Cardiovascular:  ?   Pulses: Normal pulses.  ?Musculoskeletal:     ?   General: Tenderness present.  ?   Right lower leg: Edema present.  ?   Left lower leg: Edema present.  ?   Comments: Positive Homans' sign, tenderness posterior to the knee.  Pain with palpation of the anterior knee, able to flex extend abduct and Abduct with some pain but tolerates passive ROM  ?Skin: ?   Capillary Refill: Capillary refill takes less than 2 seconds.  ?   Coloration: Skin is not jaundiced.  ?Neurological:  ?   Mental Status: She is alert. Mental status is at baseline.  ?   Coordination: Coordination normal.  ? ? ?ED Results / Procedures /  Treatments   ?Labs ?(all labs ordered are listed, but only abnormal results are displayed) ?Labs Reviewed  ?BASIC METABOLIC PANEL - Abnormal; Notable for the following components:  ?    Result Value  ? Glucose, Bld 131 (*)   ? All other components within normal limits  ?CBC WITH DIFFERENTIAL/PLATELET  ?SEDIMENTATION RATE  ?C-REACTIVE PROTEIN  ? ? ?EKG ?None ? ?Radiology ?US Venous Img Lower Unilateral Right ? ?Result Date: 07/11/2021 ?CLINICAL DATA:  RIGHT leg pain and swelling in a 46 year old female. EXAM: RIGHT LOWER EXTREMITY VENOUS DOPPLER ULTRASOUND TECHNIQUE: Gray-scale sonography with compression, as well as color and duplex ultrasound, were performed to evaluate the deep venous system(s) from the level of the common femoral vein through the popliteal and proximal calf veins. COMPARISON:  None Available. FINDINGS: VENOUS Normal compressibility of the common femoral, superficial femoral, and popliteal veins, as well as the visualized calf veins. Visualized portions of profunda femoral vein and great saphenous vein unremarkable. No filling defects to suggest DVT on grayscale or color Doppler imaging. Doppler waveforms show normal direction of venous flow, normal respiratory plasticity and response to augmentation. Limited views of the contralateral common femoral vein are unremarkable. OTHER None. Limitations: none IMPRESSION: Negative for deep venous thrombosis in the RIGHT lower extremity. Electronically Signed   By: Zetta Bills M.D.   On: 07/11/2021 14:31   ? ?Procedures ?Procedures  ? ? ?Medications Ordered in ED ?Medications - No data to display ? ?ED Course/ Medical Decision Making/ A&P ?  ?                        ?Medical Decision Making ?Amount and/or Complexity of Data Reviewed ?Labs: ordered. ? ?Risk ?Prescription drug management. ? ? ?Patient presents with right knee pain/lower extremity pain.  Differential includes but not limited to septic joint, gout, osteoarthritis, effusion, DVT. ? ?On exam  she is neurovascular intact.  She tolerates passive range of motion making septic joint unlikely.  Particularly warm or swollen, I considered gout but think slightly less likely.  Additionally she had multiple x-rays without any fracture so do not think we need to repeat x-ray today.  Given she had tenderness posteriorly to the knee proceeded with DVT study to evaluate for clot.  DVT study was negative, also check basic blood work including ESR and CRP to evaluate for signs of underlying inflammatory process. ? ?I reviewed the CBC and BMP, no leukocytosis or anemia.  No gross electrolyte derangement.  ESR and CRP are pending, patient is requesting to leave and follow-up with orthopedic doctor later this week.  I think this is reasonable. ? ?I did review her external records including from her orthopedic visit earlier this month.  Also reviewed previous x-rays through care everywhere.  For this reason I did not repeat x-rays today although it was a consideration.  Patient discharged in stable condition. ? ? ? ? ? ? ? ?Final Clinical Impression(s) / ED Diagnoses ?Final diagnoses:  ?Acute pain of right knee  ? ? ?Rx / DC Orders ?ED Discharge Orders   ? ?      Ordered  ?  predniSONE (DELTASONE) 10 MG tablet  Daily       ? 07/11/21 1556  ? ?  ?  ? ?  ? ? ?  ?Sherrill Raring, PA-C ?07/11/21 1558 ? ?  ?Carmin Muskrat, MD ?07/12/21 1610 ? ?

## 2021-07-11 NOTE — Discharge Instructions (Signed)
Follow-up with orthopedic doctor later this week. ?Check MyChart for results of your laboratory work-up. ?

## 2021-07-14 ENCOUNTER — Telehealth: Payer: Self-pay | Admitting: Emergency Medicine

## 2021-07-14 NOTE — Telephone Encounter (Signed)
TC from patient stating that she is having swelling in her leg. She has been having a problem with her knee but the swelling started on Tuesday and she was seen the ED. Patient states that it has worsened since then. Advised patients that we do not have any appointments left for the day. Advised patient to proceed to urgent care or ED for evaluation.  ? ?Patient verbalized understanding  ?

## 2021-08-21 ENCOUNTER — Encounter: Payer: Self-pay | Admitting: Nurse Practitioner

## 2021-08-21 ENCOUNTER — Other Ambulatory Visit: Payer: Self-pay | Admitting: Family Medicine

## 2021-08-21 ENCOUNTER — Ambulatory Visit (INDEPENDENT_AMBULATORY_CARE_PROVIDER_SITE_OTHER): Payer: Managed Care, Other (non HMO) | Admitting: Nurse Practitioner

## 2021-08-21 VITALS — BP 126/84 | HR 81 | Ht 65.0 in | Wt 238.0 lb

## 2021-08-21 DIAGNOSIS — I10 Essential (primary) hypertension: Secondary | ICD-10-CM

## 2021-08-21 DIAGNOSIS — R601 Generalized edema: Secondary | ICD-10-CM | POA: Diagnosis not present

## 2021-08-21 DIAGNOSIS — E782 Mixed hyperlipidemia: Secondary | ICD-10-CM

## 2021-08-21 MED ORDER — LISINOPRIL 10 MG PO TABS: 10.0000 mg | ORAL_TABLET | Freq: Every day | ORAL | 3 refills | Status: DC

## 2021-08-21 NOTE — Assessment & Plan Note (Signed)
Changed medication due to worsening edema. Started patient on lisinopril 10 mg tablet by mouth daily. Check BP daily and report uncontrolled hypertension

## 2021-08-21 NOTE — Progress Notes (Signed)
Acute Office Visit  Subjective:     Patient ID: Rebecca Garner, adult    DOB: 1975/09/27, 46 y.o.   MRN: [id number]  Chief Complaint  Patient presents with   Foot Swelling    HPI Patient is in today for Edema: Patient complains of edema. The location of the edema is lower leg(s) bilateral.  The edema has been moderate.  Onset of symptoms was a few weeks ago, unchanged since that time. The edema is present all day. The patient states the problem is long-standing.  The swelling has been aggravated by use of medication such as Amlodipine , relieved by support stockings, and been associated with weight gain. Cardiac risk factors include diabetes mellitus and obesity (BMI >= 30 kg/m2).   Pt presents for follow up of hypertension. Patient was diagnosed in 02/01/2020 The patient is tolerating the medication well with side effects of edema. Compliance with treatment has been good; including taking medication as directed , maintains a healthy diet and regular exercise regimen , and following up as directed. Discontinued amlodipine. Patient started on lisinopril 10 mg tablet by mouth daily.    Smoking cessation instruction/counseling given:  counseled patient on the dangers of tobacco use, advised patient to stop smoking, and reviewed strategies to maximize success   Review of Systems  Constitutional: Negative.   HENT: Negative.    Eyes: Negative.   Respiratory: Negative.    Cardiovascular:  Positive for leg swelling.  Genitourinary: Negative.   Musculoskeletal: Negative.   Skin: Negative.   Neurological:  Positive for tingling.  All other systems reviewed and are negative.       Objective:    BP 126/84   Pulse 81   Ht 5\' 5"  (1.651 m)   Wt 238 lb (108 kg)   SpO2 94%   BMI 39.61 kg/m  BP Readings from Last 3 Encounters:  08/21/21 126/84  07/11/21 136/80  06/15/21 116/76   Wt Readings from Last 3 Encounters:  08/21/21 238 lb (108 kg)  07/11/21 225 lb (102.1 kg)  06/29/21 235  lb (106.6 kg)      Physical Exam Vitals and nursing note reviewed.  Constitutional:      Appearance: Normal appearance. She is obese.  HENT:     Head: Normocephalic.     Right Ear: External ear normal.     Left Ear: External ear normal.     Nose: Nose normal.     Mouth/Throat:     Mouth: Mucous membranes are moist.     Pharynx: Oropharynx is clear.  Eyes:     Conjunctiva/sclera: Conjunctivae normal.  Cardiovascular:     Rate and Rhythm: Normal rate and regular rhythm.     Pulses: Normal pulses.     Heart sounds: Normal heart sounds.  Pulmonary:     Effort: Pulmonary effort is normal.     Breath sounds: Normal breath sounds.  Abdominal:     General: Bowel sounds are normal.  Musculoskeletal:     Right ankle: Tenderness present.     Left ankle: Tenderness present.     Right foot: Swelling present.     Left foot: Swelling present.  Skin:    General: Skin is warm.  Neurological:     General: No focal deficit present.     Mental Status: She is alert and oriented to person, place, and time.  Psychiatric:        Behavior: Behavior normal.     No results found for any visits on  08/21/21.      Assessment & Plan:  Bilateral lower extremity edema. Symptoms not well controlled in the past few weeks, compression socks not helping any more, pat denies excessive use of salt in diet.  Encouraged smoking cessation,  Continue elevation and compression socks. Discontinued amlodipine due to side effects of edema. Started patient on lisinopril .  Patient knows to follow up with worsening unresolved symptoms   Problem List Items Addressed This Visit       Cardiovascular and Mediastinum   Essential (primary) hypertension    Changed medication due to worsening edema. Started patient on lisinopril 10 mg tablet by mouth daily. Check BP daily and report uncontrolled hypertension      Relevant Medications   lisinopril (ZESTRIL) 10 MG tablet   Other Visit Diagnoses      Generalized edema    -  Primary       Meds ordered this encounter  Medications   lisinopril (ZESTRIL) 10 MG tablet    Sig: Take 1 tablet (10 mg total) by mouth daily.    Dispense:  90 tablet    Refill:  3    Order Specific Question:   Supervising Provider    Answer:   Mechele Claude [id number]    Return if symptoms worsen or fail to improve.  Daryll Drown, NP

## 2021-08-21 NOTE — Patient Instructions (Signed)
Edema ? ?Edema is when you have too much fluid in your body or under your skin. Edema may make your legs, feet, and ankles swell. Swelling often happens in looser tissues, such as around your eyes. This is a common condition. It gets more common as you get older. ?There are many possible causes of edema. These include: ?Eating too much salt (sodium). ?Being on your feet or sitting for a long time. ?Certain medical conditions, such as: ?Pregnancy. ?Heart failure. ?Liver disease. ?Kidney disease. ?Cancer. ?Hot weather may make edema worse. Edema is usually painless. Your skin may look swollen or shiny. ?Follow these instructions at home: ?Medicines ?Take over-the-counter and prescription medicines only as told by your doctor. ?Your doctor may prescribe a medicine to help your body get rid of extra water (diuretic). Take this medicine if you are told to take it. ?Eating and drinking ?Eat a low-salt (low-sodium) diet as told by your doctor. Sometimes, eating less salt may reduce swelling. ?Depending on the cause of your swelling, you may need to limit how much fluid you drink (fluid restriction). ?General instructions ?Raise the injured area above the level of your heart while you are sitting or lying down. ?Do not sit still or stand for a long time. ?Do not wear tight clothes. Do not wear garters on your upper legs. ?Exercise your legs. This can help the swelling go down. ?Wear compression stockings as told by your doctor. It is important that these are the right size. These should be prescribed by your doctor to prevent possible injuries. ?If elastic bandages or wraps are recommended, use them as told by your doctor. ?Contact a doctor if: ?Treatment is not working. ?You have heart, liver, or kidney disease and have symptoms of edema. ?You have sudden and unexplained weight gain. ?Get help right away if: ?You have shortness of breath or chest pain. ?You cannot breathe when you lie down. ?You have pain, redness, or  warmth in the swollen areas. ?You have heart, liver, or kidney disease and get edema all of a sudden. ?You have a fever and your symptoms get worse all of a sudden. ?These symptoms may be an emergency. Get help right away. Call 911. ?Do not wait to see if the symptoms will go away. ?Do not drive yourself to the hospital. ?Summary ?Edema is when you have too much fluid in your body or under your skin. ?Edema may make your legs, feet, and ankles swell. Swelling often happens in looser tissues, such as around your eyes. ?Raise the injured area above the level of your heart while you are sitting or lying down. ?Follow your doctor's instructions about diet and how much fluid you can drink. ?This information is not intended to replace advice given to you by your health care provider. Make sure you discuss any questions you have with your health care provider. ?Document Revised: 10/24/2020 Document Reviewed: 10/24/2020 ?Elsevier Patient Education ? 2023 [employer] ? ?

## 2021-08-22 ENCOUNTER — Telehealth: Payer: Self-pay | Admitting: Family Medicine

## 2021-08-22 NOTE — Telephone Encounter (Signed)
She can schedule an appointment with me for this. She is not on metformin.

## 2021-08-22 NOTE — Telephone Encounter (Signed)
Patient states that she cannot take metformin. She would like to get on something for weight loss.

## 2021-08-23 NOTE — Telephone Encounter (Signed)
Spoke with patient, appointment scheduled with Deliah Boston on 08/30/21 at 1:50 PM.

## 2021-08-30 ENCOUNTER — Encounter: Payer: Self-pay | Admitting: Family Medicine

## 2021-08-30 ENCOUNTER — Ambulatory Visit (INDEPENDENT_AMBULATORY_CARE_PROVIDER_SITE_OTHER): Payer: Managed Care, Other (non HMO) | Admitting: Family Medicine

## 2021-08-30 VITALS — BP 137/83 | HR 86 | Temp 98.1°F | Ht 65.0 in | Wt 239.0 lb

## 2021-08-30 DIAGNOSIS — R7303 Prediabetes: Secondary | ICD-10-CM | POA: Diagnosis not present

## 2021-08-30 DIAGNOSIS — E669 Obesity, unspecified: Secondary | ICD-10-CM | POA: Diagnosis not present

## 2021-08-30 DIAGNOSIS — M7989 Other specified soft tissue disorders: Secondary | ICD-10-CM

## 2021-08-30 LAB — BAYER DCA HB A1C WAIVED: HB A1C (BAYER DCA - WAIVED): 6.1 % — ABNORMAL HIGH (ref 4.8–5.6)

## 2021-08-30 NOTE — Progress Notes (Signed)
Assessment & Plan:  1. Obesity (BMI 30-39.9) Continue healthy eating. Encouraged to start exercising. Will assess A1c to see if she is now a diabetic given her elevated glucose reading as this would give [place] more options for medications to help with weight loss.  2. Prediabetes - Bayer DCA Hb A1c Waived  3. Swelling of both lower extremities Education provided on edema. Continue wearing compression hose and elevating feet.  - Anemia Profile B - CMP14+EGFR - TSH - T4, free - T3 - Specimen status report   Hendricks Limes, MSN, APRN, FNP-C [facility]  Subjective:   Patient ID: Rebecca Garner, adult    DOB: 08/10/1975, 46 y.o.   MRN: [id number]  HPI: Rebecca Garner is a 46 y.o. adult presenting on 08/30/2021 for Weight Loss (Options ) and Joint Swelling (Bilateral lower leg swelling that has been swelling )  Patient is wanting help with weight loss. She has been eating healthy. She has cut back on [facility]. She is eating baked foods and salads. She is limiting sweets and carbs. However, she is not doing any exercise   She also reports swelling in her legs for a couple of months. The right side is worse than the left. She does limit salt. She is elevating her legs. She wears compression hose and reports she still swells. States her blood sugar was 300 the other day.    ROS: Negative unless specifically indicated above in HPI.   Relevant past medical history reviewed and updated as indicated.   Allergies and medications reviewed and updated.   Current Outpatient Medications:    albuterol (VENTOLIN HFA) 108 (90 Base) MCG/ACT inhaler, Inhale 2 puffs into the lungs every 6 (six) hours as needed for wheezing or shortness of breath., Disp: 18 g, Rfl: 5   atenolol (TENORMIN) 50 MG tablet, Take 1 tablet (50 mg total) by mouth daily., Disp: 90 tablet, Rfl: 1   atorvastatin (LIPITOR) 40 MG tablet, TAKE 1 TABLET DAILY, Disp: 90 tablet, Rfl: 0   busPIRone (BUSPAR) 10  MG tablet, Take 1 tablet (10 mg total) by mouth 2 (two) times daily., Disp: 60 tablet, Rfl: 1   escitalopram (LEXAPRO) 10 MG tablet, Take 1 tablet (10 mg total) by mouth daily., Disp: 30 tablet, Rfl: 2   lisinopril (ZESTRIL) 10 MG tablet, Take 1 tablet (10 mg total) by mouth daily., Disp: 90 tablet, Rfl: 3   meloxicam (MOBIC) 15 MG tablet, Take 1 tablet (15 mg total) by mouth daily., Disp: 30 tablet, Rfl: 2   propranolol (INDERAL) 10 MG tablet, Take 1 tablet (10 mg total) by mouth 3 (three) times daily as needed (panic attacks)., Disp: 30 tablet, Rfl: 2   tolterodine (DETROL) 2 MG tablet, Take 1 tablet (2 mg total) by mouth 2 (two) times daily., Disp: 180 tablet, Rfl: 1  Allergies  Allergen Reactions   Metformin And Related Diarrhea    Objective:   BP 137/83   Pulse 86   Temp 98.1 F (36.7 C) (Temporal)   Ht 5' 5" (1.651 m)   Wt 239 lb (108.4 kg)   SpO2 92%   BMI 39.77 kg/m    Physical Exam Vitals reviewed.  Constitutional:      General: She is not in acute distress.    Appearance: Normal appearance. She is obese. She is not ill-appearing, toxic-appearing or diaphoretic.  HENT:     Head: Normocephalic and atraumatic.  Eyes:     General: No scleral icterus.  Right eye: No discharge.        Left eye: No discharge.     Conjunctiva/sclera: Conjunctivae normal.  Cardiovascular:     Rate and Rhythm: Normal rate and regular rhythm.     Heart sounds: Normal heart sounds. No murmur heard.    No friction rub. No gallop.  Pulmonary:     Effort: Pulmonary effort is normal. No respiratory distress.     Breath sounds: No stridor. Wheezing present. No rhonchi or rales.  Musculoskeletal:        General: Normal range of motion.     Cervical back: Normal range of motion.     Right lower leg: Edema present.     Left lower leg: Edema present.  Skin:    General: Skin is warm and dry.     Capillary Refill: Capillary refill takes less than 2 seconds.  Neurological:     General: No  focal deficit present.     Mental Status: She is alert and oriented to person, place, and time. Mental status is at baseline.  Psychiatric:        Mood and Affect: Mood normal.        Behavior: Behavior normal.        Thought Content: Thought content normal.        Judgment: Judgment normal.         

## 2021-08-31 LAB — ANEMIA PROFILE B
Basophils Absolute: 0 10*3/uL (ref 0.0–0.2)
Basos: 0 %
EOS (ABSOLUTE): 0.1 10*3/uL (ref 0.0–0.4)
Eos: 1 %
Ferritin: 37 ng/mL (ref 15–150)
Folate: 5 ng/mL (ref >3.0)
Hematocrit: 40.8 % (ref 34.0–46.6)
Hemoglobin: 14.1 g/dL (ref 11.1–15.9)
Immature Grans (Abs): 0 10*3/uL (ref 0.0–0.1)
Immature Granulocytes: 0 %
Iron Saturation: 11 % — ABNORMAL LOW (ref 15–55)
Iron: 33 ug/dL (ref 27–159)
Lymphocytes Absolute: 3.5 10*3/uL — ABNORMAL HIGH (ref 0.7–3.1)
Lymphs: 39 %
MCH: 31.7 pg (ref 26.6–33.0)
MCHC: 34.6 g/dL (ref 31.5–35.7)
MCV: 92 fL (ref 79–97)
Monocytes Absolute: 0.7 10*3/uL (ref 0.1–0.9)
Monocytes: 7 %
Neutrophils Absolute: 4.7 10*3/uL (ref 1.4–7.0)
Neutrophils: 53 %
Platelets: 199 10*3/uL (ref 150–450)
RBC: 4.45 x10E6/uL (ref 3.77–5.28)
RDW: 12.8 % (ref 11.7–15.4)
Retic Ct Pct: 1.8 % (ref 0.6–2.6)
Total Iron Binding Capacity: 298 ug/dL (ref 250–450)
UIBC: 265 ug/dL (ref 131–425)
Vitamin B-12: 373 pg/mL (ref 232–1245)
WBC: 9 10*3/uL (ref 3.4–10.8)

## 2021-08-31 LAB — CMP14+EGFR
ALT: 17 IU/L (ref 0–32)
AST: 14 IU/L (ref 0–40)
Albumin/Globulin Ratio: 1.5 (ref 1.2–2.2)
Albumin: 4.1 g/dL (ref 3.8–4.8)
Alkaline Phosphatase: 103 IU/L (ref 44–121)
BUN/Creatinine Ratio: 13 (ref 9–23)
BUN: 10 mg/dL (ref 6–24)
Bilirubin Total: 0.2 mg/dL (ref 0.0–1.2)
CO2: 23 mmol/L (ref 20–29)
Calcium: 9 mg/dL (ref 8.7–10.2)
Chloride: 103 mmol/L (ref 96–106)
Creatinine, Ser: 0.79 mg/dL (ref 0.57–1.00)
Globulin, Total: 2.7 g/dL (ref 1.5–4.5)
Glucose: 122 mg/dL — ABNORMAL HIGH (ref 70–99)
Potassium: 3.8 mmol/L (ref 3.5–5.2)
Sodium: 141 mmol/L (ref 134–144)
Total Protein: 6.8 g/dL (ref 6.0–8.5)
eGFR: 92 mL/min/{1.73_m2} (ref >59)

## 2021-08-31 LAB — TSH: TSH: 0.376 u[IU]/mL — ABNORMAL LOW (ref 0.450–4.500)

## 2021-08-31 LAB — T4, FREE: Free T4: 1.2 ng/dL (ref 0.82–1.77)

## 2021-09-01 ENCOUNTER — Telehealth: Payer: Self-pay | Admitting: Family Medicine

## 2021-09-01 NOTE — Telephone Encounter (Signed)
Lab not back yet and patient aware.

## 2021-09-02 LAB — SPECIMEN STATUS REPORT

## 2021-09-02 LAB — T3: T3, Total: 138 ng/dL (ref 71–180)

## 2021-09-05 ENCOUNTER — Encounter: Payer: Self-pay | Admitting: Family Medicine

## 2021-09-12 ENCOUNTER — Encounter: Payer: Self-pay | Admitting: Family Medicine

## 2021-09-12 ENCOUNTER — Telehealth: Payer: Self-pay | Admitting: Family Medicine

## 2021-09-12 ENCOUNTER — Ambulatory Visit (INDEPENDENT_AMBULATORY_CARE_PROVIDER_SITE_OTHER): Payer: Managed Care, Other (non HMO) | Admitting: Family Medicine

## 2021-09-12 VITALS — BP 167/94 | HR 63 | Temp 97.9°F | Resp 20 | Ht 65.0 in | Wt 239.0 lb

## 2021-09-12 DIAGNOSIS — I10 Essential (primary) hypertension: Secondary | ICD-10-CM | POA: Diagnosis not present

## 2021-09-12 DIAGNOSIS — N939 Abnormal uterine and vaginal bleeding, unspecified: Secondary | ICD-10-CM

## 2021-09-12 DIAGNOSIS — N951 Menopausal and female climacteric states: Secondary | ICD-10-CM | POA: Diagnosis not present

## 2021-09-12 LAB — HEMOGLOBIN, FINGERSTICK: Hemoglobin: 16.3 g/dL — ABNORMAL HIGH (ref 11.1–15.9)

## 2021-09-12 MED ORDER — LISINOPRIL 20 MG PO TABS: 20.0000 mg | ORAL_TABLET | Freq: Every day | ORAL | 2 refills | Status: DC

## 2021-09-12 NOTE — Progress Notes (Signed)
Assessment & Plan:  1. Essential (primary) hypertension Uncontrolled. Increasing Lisinopril from 10 mg to 20 mg daily. - lisinopril (ZESTRIL) 20 MG tablet; Take 1 tablet (20 mg total) by mouth daily.  Dispense: 30 tablet; Refill: 2  2. Vagina bleeding - Hemoglobin, fingerstick = 16.3  3. Perimenopausal Reassurance provided. Discussed six months without a period would mean she has entered menopause.   Follow up plan: Return in about 2 weeks (around 09/26/2021) for BS & HTN.  Deliah Boston, MSN, APRN, FNP-C [facility]  Subjective:   Patient ID: Rebecca Garner, adult    DOB: [birth year], 46 y.o.   MRN: [id number]  HPI: Rebecca Garner is a 46 y.o. adult presenting on 09/12/2021 for Hypertension and Vaginal Bleeding  Patient walked in this morning due to elevated blood pressure. She reports she felt like she was going to black-out and was shaky and dizzy with a headache so she called EMS at which time her BP was 140/112 and her blood sugar was 320. Denies any chest pain or shortness of breath during all of this. Then this morning at work her manual blood pressure was 180/122. She does take Lisinopril 10 mg daily and atenolol 50 mg daily.   She is also concerned that she stopped having periods for 4-5 months and then started bleeding again. This period started heavy two and a half weeks ago, but is getting light and is almost gone.   ROS: Negative unless specifically indicated above in HPI.   Relevant past medical history reviewed and updated as indicated.   Allergies and medications reviewed and updated.   Current Outpatient Medications:    albuterol (VENTOLIN HFA) 108 (90 Base) MCG/ACT inhaler, Inhale 2 puffs into the lungs every 6 (six) hours as needed for wheezing or shortness of breath., Disp: 18 g, Rfl: 5   atenolol (TENORMIN) 50 MG tablet, Take 1 tablet (50 mg total) by mouth daily., Disp: 90 tablet, Rfl: 1   atorvastatin (LIPITOR) 40 MG tablet, TAKE 1  TABLET DAILY, Disp: 90 tablet, Rfl: 0   busPIRone (BUSPAR) 10 MG tablet, Take 1 tablet (10 mg total) by mouth 2 (two) times daily., Disp: 60 tablet, Rfl: 1   escitalopram (LEXAPRO) 10 MG tablet, Take 1 tablet (10 mg total) by mouth daily., Disp: 30 tablet, Rfl: 2   lisinopril (ZESTRIL) 10 MG tablet, Take 1 tablet (10 mg total) by mouth daily., Disp: 90 tablet, Rfl: 3   meloxicam (MOBIC) 15 MG tablet, Take 1 tablet (15 mg total) by mouth daily., Disp: 30 tablet, Rfl: 2   propranolol (INDERAL) 10 MG tablet, Take 1 tablet (10 mg total) by mouth 3 (three) times daily as needed (panic attacks)., Disp: 30 tablet, Rfl: 2   tolterodine (DETROL) 2 MG tablet, Take 1 tablet (2 mg total) by mouth 2 (two) times daily., Disp: 180 tablet, Rfl: 1  Allergies  Allergen Reactions   Metformin And Related Diarrhea    Objective:   BP (!) 167/94   Pulse 63   Temp 97.9 F (36.6 C) (Oral)   Resp 20   Ht 5\' 5"  (1.651 m)   Wt 239 lb (108.4 kg)   SpO2 95%   BMI 39.77 kg/m    Physical Exam Vitals reviewed.  Constitutional:      General: She is not in acute distress.    Appearance: Normal appearance. She is not ill-appearing, toxic-appearing or diaphoretic.  HENT:     Head: Normocephalic and atraumatic.  Eyes:  General: No scleral icterus.       Right eye: No discharge.        Left eye: No discharge.     Conjunctiva/sclera: Conjunctivae normal.  Cardiovascular:     Rate and Rhythm: Normal rate and regular rhythm.     Heart sounds: Normal heart sounds. No murmur heard.    No friction rub. No gallop.  Pulmonary:     Effort: Pulmonary effort is normal. No respiratory distress.     Breath sounds: Normal breath sounds. No stridor. No wheezing, rhonchi or rales.  Musculoskeletal:        General: Normal range of motion.     Cervical back: Normal range of motion.  Skin:    General: Skin is warm and dry.     Capillary Refill: Capillary refill takes less than 2 seconds.  Neurological:     General: No  focal deficit present.     Mental Status: She is alert and oriented to person, place, and time. Mental status is at baseline.  Psychiatric:        Mood and Affect: Mood is anxious.        Behavior: Behavior normal.        Thought Content: Thought content normal.        Judgment: Judgment normal.

## 2021-09-12 NOTE — Telephone Encounter (Signed)
Pt has been informed that she can take another 10mg  this evening with dinner. Advised to not take BP so frequently. Advised to try and lay down and rest to help with BP. Pt understood and will start one 20mg  of BP med tomorrow and call back if needed.

## 2021-09-12 NOTE — Telephone Encounter (Signed)
I increased her dose from 10 mg to 20 mg and instructed her to take an extra tablet of her 10 mg today since she had already had one dose. She needs to stop checking it so frequently, she is making it go up by doing this. She is only allowed to check it once daily at least an hour after she has had her medication.

## 2021-09-12 NOTE — Telephone Encounter (Signed)
Sending this to you because you saw her this morning.  You haven't had a chance to finish your notes yet so I wasn't sure what you had done today and what I should tell her.

## 2021-09-12 NOTE — Telephone Encounter (Signed)
Pt called stating her BP is still high (169/105) and she doesn't know what to do. Took her BP medicine this morning but BP wont come down.   Please call patient.

## 2021-09-26 ENCOUNTER — Ambulatory Visit (INDEPENDENT_AMBULATORY_CARE_PROVIDER_SITE_OTHER): Payer: Managed Care, Other (non HMO) | Admitting: Family Medicine

## 2021-09-26 ENCOUNTER — Encounter: Payer: Self-pay | Admitting: Family Medicine

## 2021-09-26 VITALS — BP 109/70 | HR 62 | Temp 97.1°F | Ht 65.0 in | Wt 222.0 lb

## 2021-09-26 DIAGNOSIS — I10 Essential (primary) hypertension: Secondary | ICD-10-CM

## 2021-09-26 DIAGNOSIS — R7303 Prediabetes: Secondary | ICD-10-CM

## 2021-09-26 NOTE — Progress Notes (Signed)
Assessment & Plan:  1. Essential (primary) hypertension Well controlled on current regimen.   2. Prediabetes Well controlled on current regimen.  Discussed and education provided on the healthy plate method.     Follow up plan: Return in about 3 months (around 12/27/2021) for annual physical with anybody.  Rebecca Boston, MSN, APRN, FNP-C [facility]  Subjective:   Patient ID: Rebecca Garner, adult    DOB: 1976/01/26, 46 y.o.   MRN: [id number]  HPI: Rebecca Garner is a 46 y.o. adult presenting on 09/26/2021 for Hypertension and BS (2 week follow up)  Patient was seen 2 weeks ago at which time her lisinopril was increased from 10 mg to 20 mg daily.  She has not been checking her blood pressure at home.  She has been wearing the freestyle libre for the past 2 weeks due to hyperglycemia.     ROS: Negative unless specifically indicated above in HPI.   Relevant past medical history reviewed and updated as indicated.   Allergies and medications reviewed and updated.   Current Outpatient Medications:    albuterol (VENTOLIN HFA) 108 (90 Base) MCG/ACT inhaler, Inhale 2 puffs into the lungs every 6 (six) hours as needed for wheezing or shortness of breath., Disp: 18 g, Rfl: 5   atenolol (TENORMIN) 50 MG tablet, Take 1 tablet (50 mg total) by mouth daily., Disp: 90 tablet, Rfl: 1   atorvastatin (LIPITOR) 40 MG tablet, TAKE 1 TABLET DAILY, Disp: 90 tablet, Rfl: 0   busPIRone (BUSPAR) 10 MG tablet, Take 1 tablet (10 mg total) by mouth 2 (two) times daily., Disp: 60 tablet, Rfl: 1   escitalopram (LEXAPRO) 10 MG tablet, Take 1 tablet (10 mg total) by mouth daily., Disp: 30 tablet, Rfl: 2   lisinopril (ZESTRIL) 20 MG tablet, Take 1 tablet (20 mg total) by mouth daily., Disp: 30 tablet, Rfl: 2   meloxicam (MOBIC) 15 MG tablet, Take 1 tablet (15 mg total) by mouth daily., Disp: 30 tablet, Rfl: 2   propranolol (INDERAL) 10 MG tablet, Take 1 tablet (10 mg total) by mouth 3  (three) times daily as needed (panic attacks)., Disp: 30 tablet, Rfl: 2   tolterodine (DETROL) 2 MG tablet, Take 1 tablet (2 mg total) by mouth 2 (two) times daily., Disp: 180 tablet, Rfl: 1  Allergies  Allergen Reactions   Metformin And Related Diarrhea    Objective:   BP 109/70   Pulse 62   Temp (!) 97.1 F (36.2 C) (Temporal)   Ht 5\' 5"  (1.651 m)   Wt 222 lb (100.7 kg)   SpO2 96%   BMI 36.94 kg/m    Physical Exam Vitals reviewed.  Constitutional:      General: She is not in acute distress.    Appearance: Normal appearance. She is obese. She is not ill-appearing, toxic-appearing or diaphoretic.  HENT:     Head: Normocephalic and atraumatic.  Eyes:     General: No scleral icterus.       Right eye: No discharge.        Left eye: No discharge.     Conjunctiva/sclera: Conjunctivae normal.  Cardiovascular:     Rate and Rhythm: Normal rate and regular rhythm.     Heart sounds: Normal heart sounds. No murmur heard.    No friction rub. No gallop.  Pulmonary:     Effort: Pulmonary effort is normal. No respiratory distress.     Breath sounds: Normal breath sounds. No stridor. No wheezing, rhonchi or  rales.  Musculoskeletal:        General: Normal range of motion.     Cervical back: Normal range of motion.  Skin:    General: Skin is warm and dry.  Neurological:     Mental Status: She is alert and oriented to person, place, and time. Mental status is at baseline.  Psychiatric:        Mood and Affect: Mood normal.        Behavior: Behavior normal.        Thought Content: Thought content normal.        Judgment: Judgment normal.

## 2021-10-10 ENCOUNTER — Telehealth: Payer: Self-pay | Admitting: Family Medicine

## 2021-10-10 NOTE — Telephone Encounter (Signed)
This same conversation came up in June. I have metformin on her allergy list and NOT on her medication list. To my knowledge she is not on metformin.

## 2021-10-10 NOTE — Telephone Encounter (Signed)
Pt did stop Metformin - she is experiencing highs and lows with BS - feel like she needs a different med, since she can't take the metformin. Appt made to discuss

## 2021-10-11 ENCOUNTER — Ambulatory Visit: Payer: Managed Care, Other (non HMO) | Admitting: Family Medicine

## 2021-10-12 ENCOUNTER — Ambulatory Visit (INDEPENDENT_AMBULATORY_CARE_PROVIDER_SITE_OTHER): Payer: Managed Care, Other (non HMO) | Admitting: Family Medicine

## 2021-10-12 ENCOUNTER — Encounter: Payer: Self-pay | Admitting: Family Medicine

## 2021-10-12 VITALS — BP 123/82 | HR 66 | Temp 97.5°F | Ht 65.0 in | Wt 221.0 lb

## 2021-10-12 DIAGNOSIS — R7303 Prediabetes: Secondary | ICD-10-CM

## 2021-10-12 LAB — BAYER DCA HB A1C WAIVED: HB A1C (BAYER DCA - WAIVED): 6.1 % — ABNORMAL HIGH (ref 4.8–5.6)

## 2021-10-12 NOTE — Progress Notes (Signed)
Assessment & Plan:  1. Prediabetes A1c remains 6.1. Discussed with patient she is not a diabetic and does not need a medication to treat. Encouraged regular meals to prevent fluctuations in blood sugar. Discussed and provided education about the healthy plate method. Referring to our clinical pharmacist for dietary education. New glucometer provided to her as I also question the reliability of her current one. - Bayer DCA Hb A1c Waived - AMB Referral to [facility]   Follow up plan: Return in about 3 months (around 01/12/2022) for annual physical.  Deliah Boston, MSN, APRN, FNP-C [facility]  Subjective:   Patient ID: Rebecca Garner, adult    DOB: November 29, 1975, 46 y.o.   MRN: [id number]  HPI: Rebecca Garner is a 46 y.o. adult presenting on 10/12/2021 for BS running high (Patient states her BS has been running around 270-300.  )  Patient reports her blood sugars having been running 270-300. She feels she needs medication for diabetes. She took metformin previously for prediabetes; this was discontinued in March due to side effects at which time her A1c was 6.3. In June her A1c was 6.1. In July she worse the freestyle libre x2 weeks during which she was active 98% of the time and in target range 99% of the time. She states she is drinking glucose control milkshakes and not eating very much because she is afraid of water her blood sugars are going to do. She reports having lows during the night after eating a small salad for dinner.    ROS: Negative unless specifically indicated above in HPI.   Relevant past medical history reviewed and updated as indicated.   Allergies and medications reviewed and updated.   Current Outpatient Medications:    albuterol (VENTOLIN HFA) 108 (90 Base) MCG/ACT inhaler, Inhale 2 puffs into the lungs every 6 (six) hours as needed for wheezing or shortness of breath., Disp: 18 g, Rfl: 5   atenolol (TENORMIN) 50 MG tablet, Take 1  tablet (50 mg total) by mouth daily., Disp: 90 tablet, Rfl: 1   atorvastatin (LIPITOR) 40 MG tablet, TAKE 1 TABLET DAILY, Disp: 90 tablet, Rfl: 0   busPIRone (BUSPAR) 10 MG tablet, Take 1 tablet (10 mg total) by mouth 2 (two) times daily., Disp: 60 tablet, Rfl: 1   escitalopram (LEXAPRO) 10 MG tablet, Take 1 tablet (10 mg total) by mouth daily., Disp: 30 tablet, Rfl: 2   lisinopril (ZESTRIL) 20 MG tablet, Take 1 tablet (20 mg total) by mouth daily., Disp: 30 tablet, Rfl: 2   meloxicam (MOBIC) 15 MG tablet, Take 1 tablet (15 mg total) by mouth daily., Disp: 30 tablet, Rfl: 2   propranolol (INDERAL) 10 MG tablet, Take 1 tablet (10 mg total) by mouth 3 (three) times daily as needed (panic attacks)., Disp: 30 tablet, Rfl: 2   tolterodine (DETROL) 2 MG tablet, Take 1 tablet (2 mg total) by mouth 2 (two) times daily., Disp: 180 tablet, Rfl: 1  Allergies  Allergen Reactions   Metformin And Related Diarrhea    Objective:   BP 123/82   Pulse 66   Temp (!) 97.5 F (36.4 C) (Temporal)   Ht 5\' 5"  (1.651 m)   Wt 221 lb (100.2 kg)   SpO2 92%   BMI 36.78 kg/m    Physical Exam Vitals reviewed.  Constitutional:      General: She is not in acute distress.    Appearance: Normal appearance. She is not ill-appearing, toxic-appearing or diaphoretic.  HENT:  Head: Normocephalic and atraumatic.  Eyes:     General: No scleral icterus.       Right eye: No discharge.        Left eye: No discharge.     Conjunctiva/sclera: Conjunctivae normal.  Cardiovascular:     Rate and Rhythm: Normal rate.  Pulmonary:     Effort: Pulmonary effort is normal. No respiratory distress.  Musculoskeletal:        General: Normal range of motion.     Cervical back: Normal range of motion.  Skin:    General: Skin is warm and dry.  Neurological:     Mental Status: She is alert and oriented to person, place, and time. Mental status is at baseline.  Psychiatric:        Mood and Affect: Mood normal.        Behavior:  Behavior normal.        Thought Content: Thought content normal.        Judgment: Judgment normal.

## 2021-11-10 ENCOUNTER — Encounter: Payer: Self-pay | Admitting: Family Medicine

## 2021-11-10 ENCOUNTER — Ambulatory Visit: Payer: Managed Care, Other (non HMO) | Admitting: Family Medicine

## 2021-11-10 VITALS — Temp 97.9°F | Ht 65.0 in | Wt 219.1 lb

## 2021-11-10 DIAGNOSIS — F419 Anxiety disorder, unspecified: Secondary | ICD-10-CM | POA: Diagnosis not present

## 2021-11-10 DIAGNOSIS — R001 Bradycardia, unspecified: Secondary | ICD-10-CM

## 2021-11-10 DIAGNOSIS — R42 Dizziness and giddiness: Secondary | ICD-10-CM | POA: Diagnosis not present

## 2021-11-10 DIAGNOSIS — I10 Essential (primary) hypertension: Secondary | ICD-10-CM | POA: Diagnosis not present

## 2021-11-10 MED ORDER — ATENOLOL 25 MG PO TABS: 25.0000 mg | ORAL_TABLET | Freq: Every day | ORAL | 1 refills | Status: DC

## 2021-11-10 MED ORDER — OLMESARTAN MEDOXOMIL 20 MG PO TABS: 20.0000 mg | ORAL_TABLET | Freq: Every day | ORAL | 1 refills | Status: DC

## 2021-11-10 MED ORDER — HYDROXYZINE HCL 10 MG PO TABS: 10.0000 mg | ORAL_TABLET | Freq: Three times a day (TID) | ORAL | 0 refills | Status: AC | PRN

## 2021-11-10 NOTE — Patient Instructions (Signed)
Dizziness Dizziness is a common problem. It is a feeling of unsteadiness or light-headedness. You may feel like you are about to faint. Dizziness can lead to injury if you stumble or fall. Anyone can become dizzy, but dizziness is more common in older adults. This condition can be caused by a number of things, including medicines, dehydration, or illness. Follow these instructions at home: Eating and drinking  Drink enough fluid to keep your urine pale yellow. This helps to keep you from becoming dehydrated. Try to drink more clear fluids, such as water. Do not drink alcohol. Limit your caffeine intake if told to do so by your health care provider. Check ingredients and nutrition facts to see if a food or beverage contains caffeine. Limit your salt (sodium) intake if told to do so by your health care provider. Check ingredients and nutrition facts to see if a food or beverage contains sodium. Activity  Avoid making quick movements. Rise slowly from chairs and steady yourself until you feel okay. In the morning, first sit up on the side of the bed. When you feel okay, stand slowly while you hold onto something until you know that your balance is good. If you need to stand in one place for a long time, move your legs often. Tighten and relax the muscles in your legs while you are standing. Do not drive or use machinery if you feel dizzy. Avoid bending down if you feel dizzy. Place items in your home so that they are easy for you to reach without leaning over. Lifestyle Do not use any products that contain nicotine or tobacco. These products include cigarettes, chewing tobacco, and vaping devices, such as e-cigarettes. If you need help quitting, ask your health care provider. Try to reduce your stress level by using methods such as yoga or meditation. Talk with your health care provider if you need help to manage your stress. General instructions Watch your dizziness for any changes. Take  over-the-counter and prescription medicines only as told by your health care provider. Talk with your health care provider if you think that your dizziness is caused by a medicine that you are taking. Tell a friend or a family member that you are feeling dizzy. If he or she notices any changes in your behavior, have this person call your health care provider. Keep all follow-up visits. This is important. Contact a health care provider if: Your dizziness does not go away or you have new symptoms. Your dizziness or light-headedness gets worse. You feel nauseous. You have reduced hearing. You have a fever. You have neck pain or a stiff neck. Your dizziness leads to an injury or a fall. Get help right away if: You vomit or have diarrhea and are unable to eat or drink anything. You have problems talking, walking, swallowing, or using your arms, hands, or legs. You feel generally weak. You have any bleeding. You are not thinking clearly or you have trouble forming sentences. It may take a friend or family member to notice this. You have chest pain, abdominal pain, shortness of breath, or sweating. Your vision changes or you develop a severe headache. These symptoms may represent a serious problem that is an emergency. Do not wait to see if the symptoms will go away. Get medical help right away. Call your local emergency services (911 in the [place] Do not drive yourself to the hospital. Summary Dizziness is a feeling of unsteadiness or light-headedness. This condition can be caused by a number of   things, including medicines, dehydration, or illness. Anyone can become dizzy, but dizziness is more common in older adults. Drink enough fluid to keep your urine pale yellow. Do not drink alcohol. Avoid making quick movements if you feel dizzy. Monitor your dizziness for any changes. This information is not intended to replace advice given to you by your health care provider. Make sure you discuss any  questions you have with your health care provider. Document Revised: 01/25/2020 Document Reviewed: 01/25/2020 Elsevier Patient Education  2023 [employer]  

## 2021-11-10 NOTE — Progress Notes (Signed)
Acute Office Visit  Subjective:     Patient ID: Rebecca Garner, adult    DOB: Oct 05, 1975, 46 y.o.   MRN: [id number]  Chief Complaint  Patient presents with   Dizziness   Bradycardia    HPI Rebecca Garner reports dizziness for the last week. She reports this as a sensation of the room spinning. This occurs when walking. It lasts for a few minutes at a time and resolves when she stops walking. She also reports a low heart rate. She wears a watch that monitors her heart rate and her resting heart rate has consistently been in the 40-50s for the last week. She has been on lisinopril for about 1 month and this has caused a dry cough. She has been propanolol TID prn for anxiety and she does take this at least once a day, sometimes TID. She also has been taking atenolol 50 mg daily. She does not check her BP at home.   Review of Systems  Constitutional:  Negative for chills, diaphoresis and fever.  HENT:  Negative for congestion and sore throat.   Eyes:  Negative for double vision and photophobia.  Respiratory:  Positive for cough. Negative for sputum production, shortness of breath and wheezing.   Cardiovascular:  Negative for chest pain, palpitations, orthopnea, claudication, leg swelling and PND.  Gastrointestinal:  Negative for nausea and vomiting.  Neurological:  Positive for dizziness. Negative for tingling, tremors, sensory change, speech change, focal weakness, weakness and headaches.        Objective:    Temp 97.9 F (36.6 C) (Temporal)   Ht 5\' 5"  (1.651 m)   Wt 219 lb 2 oz (99.4 kg)   BMI 36.46 kg/m  BP Readings from Last 3 Encounters:  10/12/21 123/82  09/26/21 109/70  09/12/21 (!) 167/94      Physical Exam Vitals and nursing note reviewed.  Constitutional:      General: She is not in acute distress.    Appearance: She is not ill-appearing, toxic-appearing or diaphoretic.  HENT:     Head: Normocephalic and atraumatic.     Nose: Nose normal.     Mouth/Throat:      Mouth: Mucous membranes are moist.     Pharynx: Oropharynx is clear.  Eyes:     General: No scleral icterus.       Right eye: No discharge.        Left eye: No discharge.     Extraocular Movements: Extraocular movements intact.     Pupils: Pupils are equal, round, and reactive to light.  Neck:     Vascular: No JVD.  Cardiovascular:     Rate and Rhythm: Normal rate and regular rhythm.     Heart sounds: Normal heart sounds. No murmur heard. Pulmonary:     Effort: Pulmonary effort is normal. No respiratory distress.     Breath sounds: Normal breath sounds.  Abdominal:     General: Bowel sounds are normal. There is no distension.     Palpations: Abdomen is soft.     Tenderness: There is no abdominal tenderness. There is no guarding or rebound.  Musculoskeletal:     Cervical back: Neck supple. No rigidity.     Right lower leg: No edema.     Left lower leg: No edema.  Skin:    General: Skin is warm and dry.  Neurological:     General: No focal deficit present.     Mental Status: She is alert and oriented to person, place,  and time.     Cranial Nerves: No cranial nerve deficit.     Motor: No weakness.     Coordination: Coordination normal.     Gait: Gait normal.  Psychiatric:        Mood and Affect: Mood normal.        Behavior: Behavior normal.     No results found for any visits on 11/10/21.      Assessment & Plan:   Destanie was seen today for dizziness and bradycardia.  Diagnoses and all orders for this visit:  Dizziness -     EKG 12-Lead  Bradycardia -     EKG 12-Lead  Primary hypertension -     olmesartan (BENICAR) 20 MG tablet; Take 1 tablet (20 mg total) by mouth daily. -     atenolol (TENORMIN) 25 MG tablet; Take 1 tablet (25 mg total) by mouth daily.  Anxiety -     hydrOXYzine (ATARAX) 10 MG tablet; Take 1 tablet (10 mg total) by mouth 3 (three) times daily as needed for anxiety.  EKG with sinus brady today. Negative orthostatics. Benign exam, normal  neuro exam. Discussed that she was on 2 different BB that lower HR. Discussed that bradycardia can cause dizziness. Discontinued propanolol today for anxiety. She can take hydroxyzine prn instead. Decreased atenolol to 25 mg daily. She also is reporting a dry cough since starting lisinopril, will switch her to olmesartan instead. BP is not at goal today, will recheck next week after medication changes. Discussed labs, zio monitor if symptoms do not improve with medication changes. Follow up next week to follow up, sooner for new or worsening symptoms.   The patient indicates understanding of these issues and agrees with the plan.  Gabriel Earing, FNP

## 2021-11-30 ENCOUNTER — Ambulatory Visit: Payer: Managed Care, Other (non HMO) | Admitting: Pharmacist

## 2021-12-04 ENCOUNTER — Encounter: Payer: Self-pay | Admitting: Nurse Practitioner

## 2021-12-04 ENCOUNTER — Ambulatory Visit (INDEPENDENT_AMBULATORY_CARE_PROVIDER_SITE_OTHER): Payer: Managed Care, Other (non HMO) | Admitting: Nurse Practitioner

## 2021-12-04 VITALS — BP 136/88 | HR 86 | Temp 98.7°F | Ht 65.0 in | Wt 217.0 lb

## 2021-12-04 DIAGNOSIS — J301 Allergic rhinitis due to pollen: Secondary | ICD-10-CM | POA: Diagnosis not present

## 2021-12-04 DIAGNOSIS — R051 Acute cough: Secondary | ICD-10-CM

## 2021-12-04 MED ORDER — FLUTICASONE PROPIONATE 50 MCG/ACT NA SUSP: 2.0000 | Freq: Every day | NASAL | 6 refills | Status: AC

## 2021-12-04 MED ORDER — PSEUDOEPH-BROMPHEN-DM 30-2-10 MG/5ML PO SYRP: 5.0000 mL | ORAL_SOLUTION | Freq: Four times a day (QID) | ORAL | 0 refills | Status: DC | PRN

## 2021-12-04 NOTE — Progress Notes (Signed)
Acute Office Visit  Subjective:     Patient ID: Rebecca Garner, adult    DOB: [birth year], 46 y.o.   MRN: [id number]  Chief Complaint  Patient presents with   Cough   Nasal Congestion   Ear Fullness    Cough Pertinent negatives include no chills, ear pain, fever, rash, shortness of breath or weight loss.  Ear Fullness  Associated symptoms include coughing. Pertinent negatives include no hearing loss or rash.     Review of Systems  Constitutional: Negative.  Negative for chills, fever, malaise/fatigue and weight loss.  HENT: Negative.  Negative for ear pain, hearing loss and tinnitus.        Ear fullness  Eyes: Negative.   Respiratory:  Positive for cough. Negative for shortness of breath.   Cardiovascular: Negative.   Genitourinary: Negative.   Skin: Negative.  Negative for rash.  All other systems reviewed and are negative.       Objective:    BP 136/88   Pulse 86   Temp 98.7 F (37.1 C)   Ht 5\' 5"  (1.651 m)   Wt 217 lb (98.4 kg)   SpO2 96%   BMI 36.11 kg/m  BP Readings from Last 3 Encounters:  12/04/21 136/88  10/12/21 123/82  09/26/21 109/70   Wt Readings from Last 3 Encounters:  12/04/21 217 lb (98.4 kg)  11/10/21 219 lb 2 oz (99.4 kg)  10/12/21 221 lb (100.2 kg)      Physical Exam Vitals and nursing note reviewed.  Constitutional:      Appearance: Normal appearance. She is obese.  HENT:     Head: Normocephalic.     Right Ear: External ear normal.     Left Ear: External ear normal.     Nose: Congestion present.     Mouth/Throat:     Mouth: Mucous membranes are moist.     Pharynx: Oropharynx is clear.  Eyes:     Conjunctiva/sclera: Conjunctivae normal.  Cardiovascular:     Rate and Rhythm: Normal rate and regular rhythm.     Pulses: Normal pulses.     Heart sounds: Normal heart sounds.  Pulmonary:     Effort: Pulmonary effort is normal.     Breath sounds: Normal breath sounds.  Abdominal:     General: Bowel sounds are normal.  Skin:     General: Skin is warm.  Neurological:     General: No focal deficit present.     Mental Status: She is alert and oriented to person, place, and time.  Psychiatric:        Mood and Affect: Mood normal.        Behavior: Behavior normal.     No results found for any visits on 12/04/21.      Assessment & Plan:  Patient presents with symptoms of allergic rhinitis with cough and congestion in the past 3 days.  Advised patient to Take meds as prescribed - Use a cool mist humidifier  -Use saline nose sprays frequently -Force fluids -For fever or aches or pains- take Tylenol or ibuprofen. -Bromfed 53ml by mouth every 4 hours as needed -If symptoms do not improve, she may need to be COVID tested to rule this out Follow up with worsening unresolved symptoms  Problem List Items Addressed This Visit   None Visit Diagnoses     Seasonal allergic rhinitis due to pollen    -  Primary   Relevant Medications   fluticasone (FLONASE) 50 MCG/ACT nasal spray  Acute cough       Relevant Medications   brompheniramine-pseudoephedrine-DM 30-2-10 MG/5ML syrup       Meds ordered this encounter  Medications   brompheniramine-pseudoephedrine-DM 30-2-10 MG/5ML syrup    Sig: Take 5 mLs by mouth 4 (four) times daily as needed.    Dispense:  120 mL    Refill:  0    Order Specific Question:   Supervising Provider    Answer:   Claretta Fraise [[id number]]   fluticasone (FLONASE) 50 MCG/ACT nasal spray    Sig: Place 2 sprays into both nostrils daily.    Dispense:  16 g    Refill:  6    Order Specific Question:   Supervising Provider    Answer:   Claretta Fraise [[id number]]    Return if symptoms worsen or fail to improve.  Ivy Lynn, NP

## 2021-12-04 NOTE — Patient Instructions (Signed)
Allergies, Adult An allergy means that your body reacts to something that bothers it (allergen). This can happen from something that you eat, breathe in, or touch. Allergies often affect the nose, eyes, skin, and stomach. They can be mild, moderate, or very bad (severe). An allergy cannot spread from person to person. They can happen at any age. Sometimes, people outgrow them. What are the causes? Outdoor things, such as pollen, car fumes, and mold. Indoor things, such as dust, smoke, mold, and pets. Foods. Medicines. Things that bother your skin, such as perfume and bug bites. What increases the risk? Having family members with allergies or asthma. What are the signs or symptoms? Symptoms depend on how bad your allergy is. Mild to moderate symptoms Runny nose, stuffy nose, or sneezing. Itchy mouth, ears, or throat. A feeling of mucus dripping down the back of your throat. Sore throat. Eyes that are itchy, red, watery, or puffy. A skin rash, or red, swollen areas of skin (hives). Stomach cramps or bloating. Severe symptoms Very bad allergies to food, medicine, or bug bites may cause a very bad allergy reaction (anaphylaxis). This can be life-threatening. Symptoms include: A red face. Wheezing or coughing. Swollen lips, tongue, or mouth. Tight or swollen throat. Chest pain or tightness, or a fast heartbeat. Trouble breathing or shortness of breath. Pain in your belly (abdomen), vomiting, or watery poop (diarrhea). Feeling dizzy or fainting. How is this treated?     Treatment for this condition depends on your symptoms. Treatment may include: Cold, wet cloths for itching and swelling. Eye drops, nose sprays, or skin creams. Washing out your nose each day. A humidifier. Medicines. A change to the foods you eat. Being exposed again and again to tiny amounts of allergens. This helps your body get used to them. You might have: Allergy shots. Very small amounts of allergen put  under your tongue. An emergency shot (auto-injector pen) if you have a very bad allergy reaction. This is a medicine with a needle. You can put it into your skin by yourself. Your doctor will teach you how to use it. Follow these instructions at home: Medicines  Take or apply over-the-counter and prescription medicines only as told by your doctor. If you are at risk for a very bad allergy reaction, keep an auto-injector pen with you all the time. Eating and drinking Follow instructions from your doctor about what to eat and drink. Drink enough fluid to keep your pee (urine) pale yellow. General instructions If you have ever had a very bad allergy reaction, wear a medical alert bracelet or necklace. Stay away from things that you are allergic to. Keep all follow-up visits as told by your doctor. This is important. Contact a doctor if: Your symptoms do not get better with treatment. Get help right away if: You have symptoms of a very bad allergy reaction. These include: A swollen mouth, tongue, or throat. Pain or tightness in your chest. Trouble breathing. Being short of breath. Dizziness. Fainting. Very bad pain in your belly. Vomiting. Watery poop. These symptoms may be an emergency. Do not wait to see if the symptoms will go away. Get medical help right away. Call your local emergency services (911 in the [place] Do not drive yourself to the hospital. Summary Take or apply over-the-counter and prescription medicines only as told by your doctor. Stay away from things you are allergic to. If you are at risk for a very bad allergy reaction, carry an auto-injector pen all the time.   Wear a medical alert bracelet or necklace. Very bad allergy reactions can be life-threatening. Get help right away. This information is not intended to replace advice given to you by your health care provider. Make sure you discuss any questions you have with your health care provider. Document Revised:  12/31/2018 Document Reviewed: 12/31/2018 Elsevier Patient Education  2023 Elsevier Inc.  

## 2021-12-07 ENCOUNTER — Ambulatory Visit (INDEPENDENT_AMBULATORY_CARE_PROVIDER_SITE_OTHER): Payer: Managed Care, Other (non HMO) | Admitting: Pharmacist

## 2021-12-07 DIAGNOSIS — R7303 Prediabetes: Secondary | ICD-10-CM

## 2021-12-07 MED ORDER — GLIPIZIDE ER 5 MG PO TB24: 5.0000 mg | ORAL_TABLET | Freq: Every day | ORAL | 2 refills | Status: DC

## 2021-12-07 NOTE — Progress Notes (Signed)
    12/07/2021 Name: Rebecca Garner MRN: [id number] DOB: 12-22-1975   S:  38 yoF Presents for prediabetes evaluation, education, and management.  Patient was previously on [facility] for weight loss for about 6 months, however she is now unable to get. She has lost over 20lbs and is happy with her weight.  She is concerned about her A1c despite decrease from 6.3-->6.1%.  She does not tolerate metformin    Insurance coverage/medication affordability: [employer]a    Patient reports adherence with medications. Current medications for prediabetes: n/a Was on mounjaro Can't tolerate metformin   Patient is active during the day. She is active on her job and currently works in [employer].  Encouraged patient to increase exercise for better outcomes    Discussed meal planning options and Plate method for healthy eating Avoid sugary drinks and desserts Incorporate balanced protein, non starchy veggies, 1 serving of carbohydrate with each meal Increase water intake Increase physical activity as able      O:  Lab Results  Component Value Date   HGBA1C 6.1 (H) 10/12/2021    Lipid Panel     Component Value Date/Time   CHOL 210 (H) 02/01/2021 0936   TRIG 101 02/01/2021 0936   HDL 34 (L) 02/01/2021 0936   CHOLHDL 6.2 (H) 02/01/2021 0936   LDLCALC 158 (H) 02/01/2021 0936   A/P:  -Healthy eating and meal planning discussed   -Increase water and exercise   -patient would like medication to help with her sugar  Based on cost and insurance, we will try glipizide xl 5mg  daily.  Intolerant to metformin  -Extensively discussed pathophysiology of pre-diabetes, recommended lifestyle interventions, dietary effects on blood sugar control  -Counseled on s/sx of and management of hypoglycemia  -Next A1C anticipated 6 months.  Written patient instructions provided.  Total time in face to face counseling 25 minutes.   Regina Eck, PharmD, BCPS Clinical Pharmacist, [place]  [facility] Phone [phone]

## 2022-03-09 ENCOUNTER — Ambulatory Visit (INDEPENDENT_AMBULATORY_CARE_PROVIDER_SITE_OTHER): Payer: Managed Care, Other (non HMO) | Admitting: *Deleted

## 2022-03-09 DIAGNOSIS — Z111 Encounter for screening for respiratory tuberculosis: Secondary | ICD-10-CM | POA: Diagnosis not present

## 2022-03-09 NOTE — Progress Notes (Signed)
Tuberculin skin test, sub Q injection, left forearm. Patient tolerated welll

## 2022-03-12 ENCOUNTER — Encounter: Payer: Self-pay | Admitting: Family Medicine

## 2022-03-12 ENCOUNTER — Ambulatory Visit: Payer: Managed Care, Other (non HMO)

## 2022-03-12 LAB — TB SKIN TEST
Induration: 0 mm
TB Skin Test: NEGATIVE

## 2022-04-19 ENCOUNTER — Ambulatory Visit (INDEPENDENT_AMBULATORY_CARE_PROVIDER_SITE_OTHER): Payer: Self-pay | Admitting: Family Medicine

## 2022-04-19 ENCOUNTER — Encounter: Payer: Self-pay | Admitting: Family Medicine

## 2022-04-19 VITALS — BP 148/90 | HR 79 | Temp 97.8°F | Ht 65.0 in | Wt 222.1 lb

## 2022-04-19 DIAGNOSIS — E1159 Type 2 diabetes mellitus with other circulatory complications: Secondary | ICD-10-CM

## 2022-04-19 DIAGNOSIS — Z1211 Encounter for screening for malignant neoplasm of colon: Secondary | ICD-10-CM

## 2022-04-19 DIAGNOSIS — N3946 Mixed incontinence: Secondary | ICD-10-CM

## 2022-04-19 DIAGNOSIS — F419 Anxiety disorder, unspecified: Secondary | ICD-10-CM

## 2022-04-19 DIAGNOSIS — E785 Hyperlipidemia, unspecified: Secondary | ICD-10-CM

## 2022-04-19 DIAGNOSIS — E1169 Type 2 diabetes mellitus with other specified complication: Secondary | ICD-10-CM

## 2022-04-19 DIAGNOSIS — R7989 Other specified abnormal findings of blood chemistry: Secondary | ICD-10-CM

## 2022-04-19 DIAGNOSIS — I152 Hypertension secondary to endocrine disorders: Secondary | ICD-10-CM

## 2022-04-19 DIAGNOSIS — S0035XA Superficial foreign body of nose, initial encounter: Secondary | ICD-10-CM

## 2022-04-19 DIAGNOSIS — Z794 Long term (current) use of insulin: Secondary | ICD-10-CM

## 2022-04-19 DIAGNOSIS — F331 Major depressive disorder, recurrent, moderate: Secondary | ICD-10-CM

## 2022-04-19 DIAGNOSIS — L089 Local infection of the skin and subcutaneous tissue, unspecified: Secondary | ICD-10-CM

## 2022-04-19 DIAGNOSIS — E1165 Type 2 diabetes mellitus with hyperglycemia: Secondary | ICD-10-CM

## 2022-04-19 DIAGNOSIS — F411 Generalized anxiety disorder: Secondary | ICD-10-CM

## 2022-04-19 DIAGNOSIS — N3281 Overactive bladder: Secondary | ICD-10-CM

## 2022-04-19 LAB — BAYER DCA HB A1C WAIVED: HB A1C (BAYER DCA - WAIVED): 6.7 % — ABNORMAL HIGH (ref 4.8–5.6)

## 2022-04-19 MED ORDER — SULFAMETHOXAZOLE-TRIMETHOPRIM 800-160 MG PO TABS: 1.0000 | ORAL_TABLET | Freq: Two times a day (BID) | ORAL | 0 refills | Status: DC

## 2022-04-19 MED ORDER — GLIPIZIDE ER 5 MG PO TB24: 5.0000 mg | ORAL_TABLET | Freq: Every day | ORAL | 3 refills | Status: DC

## 2022-04-19 MED ORDER — BLOOD GLUCOSE TEST VI STRP: 1.0000 | ORAL_STRIP | Freq: Three times a day (TID) | 0 refills | Status: AC

## 2022-04-19 MED ORDER — LANCETS MISC. MISC: 1.0000 | Freq: Three times a day (TID) | 0 refills | Status: AC

## 2022-04-19 MED ORDER — OLMESARTAN MEDOXOMIL 20 MG PO TABS: 20.0000 mg | ORAL_TABLET | Freq: Every day | ORAL | 1 refills | Status: AC

## 2022-04-19 MED ORDER — TOLTERODINE TARTRATE 2 MG PO TABS: 2.0000 mg | ORAL_TABLET | Freq: Two times a day (BID) | ORAL | 1 refills | Status: AC

## 2022-04-19 MED ORDER — BUSPIRONE HCL 10 MG PO TABS: 10.0000 mg | ORAL_TABLET | Freq: Two times a day (BID) | ORAL | 1 refills | Status: AC

## 2022-04-19 MED ORDER — ESCITALOPRAM OXALATE 10 MG PO TABS: 10.0000 mg | ORAL_TABLET | Freq: Every day | ORAL | 2 refills | Status: AC

## 2022-04-19 MED ORDER — LANCET DEVICE MISC: 1.0000 | Freq: Three times a day (TID) | 0 refills | Status: AC

## 2022-04-19 MED ORDER — BLOOD GLUCOSE MONITORING SUPPL DEVI: 1.0000 | Freq: Three times a day (TID) | 0 refills | Status: AC

## 2022-04-19 NOTE — Patient Instructions (Signed)

## 2022-04-19 NOTE — Progress Notes (Signed)
Established Patient Office Visit  Subjective   Patient ID: Rebecca Garner, adult    DOB: 31-Oct-1975  Age: 47 y.o. MRN: [id number]  Chief Complaint  Patient presents with   Medical Management of Chronic Issues   Prediabetes    HPI T2DM Pt presents for initial evaluation of Type 2 diabetes mellitus.  New diagnosis today. Hx of prediabetes. Current symptoms include polyuria. Patient denies foot ulcerations, increased appetite, nausea, paresthesia of the feet, polydipsia, polyuria, visual disturbances, vomiting, and weight loss.  Current diabetic medications include glipizide Compliant with meds - Yes  Current monitoring regimen: none  Current diet:  regular Current exercise: none  Urine microalbumin UTD? No  Is She on ACE inhibitor or angiotensin II receptor blocker?  Yes, olmesartan Is She on statin? Yes atorvastatin  2. HTN Complaint with meds - Yes Current Medications - atenolol 25 mg, olmesartan 20 mg  Checking BP at home- no Pertinent ROS:  Headache - No Fatigue - No Visual Disturbances - No Chest pain - No Dyspnea - No Palpitations - No LE edema - No  3. HLD On atorvastatin. Regular diet, no exercise.   4. OAB She reports chronic urine frequency, urgency, and incontinence. Both stress and urge incontinence. She takes tolterodine with some improvement. She is interested in a referral as she has never had this evaluated.   5. Anxiety/depression Currently on lexapro and buspar BID. Intermittent compliance with medications. Reports fairly well controlled.   6. Nose infection She reports pain, tenderness, erythema and swelling around her nose ring in her left nare. This has been ongoing for 2 weeks and has been gradually worsening. There is now purulent drainage. Denies fever. Has not tried any remedies.      04/19/2022    9:21 AM 09/26/2021    2:47 PM 09/12/2021    8:25 AM  Depression screen PHQ 2/9  Decreased Interest 1 1 0  Down, Depressed, Hopeless 1 1 0   PHQ - 2 Score 2 2 0  Altered sleeping 1 1 0  Tired, decreased energy 1 2 0  Change in appetite 1 2 0  Feeling bad or failure about yourself  1 1 0  Trouble concentrating 1 1 0  Moving slowly or fidgety/restless 1 1 0  Suicidal thoughts 0 1 0  PHQ-9 Score 8 11 0  Difficult doing work/chores Somewhat difficult Somewhat difficult       04/19/2022    9:22 AM 09/26/2021    2:48 PM 09/12/2021    8:25 AM 08/30/2021    2:13 PM  GAD 7 : Generalized Anxiety Score  Nervous, Anxious, on Edge 1 1 0 1  Control/stop worrying 1 1 0 1  Worry too much - different things 1 1 0 1  Trouble relaxing 1 1 0 1  Restless 1 1 0 1  Easily annoyed or irritable 1 1 0 1  Afraid - awful might happen 1 1 0 1  Total GAD 7 Score 7 7 0 7  Anxiety Difficulty Somewhat difficult Somewhat difficult  Somewhat difficult     Past Medical History:  Diagnosis Date   Anxiety    Hypertension    Mixed hyperlipidemia 04/17/2019   Prediabetes 05/02/2020   Vitamin D deficiency 07/01/2017      ROS As per HPI.    Objective:     BP (!) 148/90   Pulse 79   Temp 97.8 F (36.6 C) (Temporal)   Ht 5' 5"$  (1.651 m)   Wt 222 lb  2 oz (100.8 kg)   SpO2 96%   BMI 36.96 kg/m    Physical Exam Vitals and nursing note reviewed.  Constitutional:      General: She is not in acute distress.    Appearance: She is obese. She is not ill-appearing, toxic-appearing or diaphoretic.  HENT:     Nose:     Comments: Nose ring present to left nare with surrounding erythema, warmth, tenderness, and swelling. No exudate present.  Cardiovascular:     Rate and Rhythm: Normal rate and regular rhythm.     Heart sounds: Normal heart sounds. No murmur heard. Pulmonary:     Effort: Pulmonary effort is normal.     Breath sounds: Normal breath sounds.  Abdominal:     General: Bowel sounds are normal. There is no distension.     Palpations: Abdomen is soft.     Tenderness: There is no abdominal tenderness. There is no guarding.   Musculoskeletal:     Cervical back: Neck supple. No rigidity.     Right lower leg: No edema.     Left lower leg: No edema.  Skin:    General: Skin is warm and dry.  Neurological:     General: No focal deficit present.     Mental Status: She is alert and oriented to person, place, and time.  Psychiatric:        Mood and Affect: Mood normal.        Behavior: Behavior normal.      No results found for any visits on 04/19/22.    The 10-year ASCVD risk score (Arnett DK, et al., 2019) is: 22.5%    Assessment & Plan:   Rebecca Garner was seen today for medical management of chronic issues and prediabetes.  Diagnoses and all orders for this visit:  Type 2 diabetes mellitus with hyperglycemia, with long-term current use of insulin (HCC) A1c is 6.7 today, at goal of <7. Continue glipizide. Discussed diet, exercise, blood sugar monitoring. Referral to nutrition placed for education. On statin and ARB. Discussed need for eye exam. Foot exam at next visit. Urine micro today.  -     Bayer DCA Hb A1c Waived -     Lipid panel -     glipiZIDE (GLUCOTROL XL) 5 MG 24 hr tablet; Take 1 tablet (5 mg total) by mouth daily with breakfast. -     Amb ref to Medical Nutrition Therapy-MNT -     Blood Glucose Monitoring Suppl DEVI; 1 each by Does not apply route in the morning, at noon, and at bedtime. May substitute to any manufacturer covered by patient's insurance. -     Glucose Blood (BLOOD GLUCOSE TEST STRIPS) STRP; 1 each by In Vitro route in the morning, at noon, and at bedtime. May substitute to any manufacturer covered by patient's insurance. -     Lancet Device MISC; 1 each by Does not apply route in the morning, at noon, and at bedtime. May substitute to any manufacturer covered by patient's insurance. -     Lancets Misc. MISC; 1 each by Does not apply route in the morning, at noon, and at bedtime. May substitute to any manufacturer covered by patient's insurance. -     Microalbumin/Creatinine Ratio,  Urine  Hypertension associated with type 2 diabetes mellitus (HCC) BP not quite at goal today. Discussed monitoring at home and notifying for elevated reading. Discussed lifestyle management.  -     Amb ref to Medical Nutrition Therapy-MNT -  olmesartan (BENICAR) 20 MG tablet; Take 1 tablet (20 mg total) by mouth daily.  Hyperlipidemia associated with type 2 diabetes mellitus ([place]) On statin. Fasting panel pending. Diet and exercise.  -     Lipid panel -     Amb ref to Medical Nutrition Therapy-MNT  Morbid obesity ([place]) BMI 36 with DM, HTN ,HLD. Diet and exercise.   Moderate recurrent major depression (HCC) Generalized anxiety disorder Fair control. Denies SI. Discussed compliance with medications.  -     busPIRone (BUSPAR) 10 MG tablet; Take 1 tablet (10 mg total) by mouth 2 (two) times daily. -     escitalopram (LEXAPRO) 10 MG tablet; Take 1 tablet (10 mg total) by mouth daily.  Abnormal TSH Will recheck today.  -     TSH -     T4, Free -     T3, Free  Mixed stress and urge incontinence Overactive bladder Referral discussed as below. Continue tolterodine.  -     tolterodine (DETROL) 2 MG tablet; Take 1 tablet (2 mg total) by mouth 2 (two) times daily. -     Ambulatory referral to Urogynecology  Colon cancer screening -     Cologuard  Foreign body in skin of nose with infection Bactrim as below. Dicussed warm compressed. Remove nose ring. Strict return precautions given.  -     sulfamethoxazole-trimethoprim (BACTRIM DS) 800-160 MG tablet; Take 1 tablet by mouth 2 (two) times daily for 7 days.   Return in about 3 months (around 07/18/2022) for chronic follow up.   The patient indicates understanding of these issues and agrees with the plan.  Gwenlyn Perking, FNP

## 2022-04-20 ENCOUNTER — Other Ambulatory Visit: Payer: Self-pay | Admitting: Family Medicine

## 2022-04-20 DIAGNOSIS — E782 Mixed hyperlipidemia: Secondary | ICD-10-CM

## 2022-04-20 LAB — MICROALBUMIN / CREATININE URINE RATIO
Creatinine, Urine: 155.7 mg/dL
Microalb/Creat Ratio: 3 mg/g creat (ref 0–29)
Microalbumin, Urine: 5.1 ug/mL

## 2022-04-20 LAB — LIPID PANEL
Chol/HDL Ratio: 4.7 ratio — ABNORMAL HIGH (ref 0.0–4.4)
Cholesterol, Total: 213 mg/dL — ABNORMAL HIGH (ref 100–199)
HDL: 45 mg/dL (ref >39)
LDL Chol Calc (NIH): 143 mg/dL — ABNORMAL HIGH (ref 0–99)
Triglycerides: 137 mg/dL (ref 0–149)
VLDL Cholesterol Cal: 25 mg/dL (ref 5–40)

## 2022-04-20 LAB — TSH: TSH: 0.611 u[IU]/mL (ref 0.450–4.500)

## 2022-04-20 LAB — T4, FREE: Free T4: 1.22 ng/dL (ref 0.82–1.77)

## 2022-04-20 LAB — T3, FREE: T3, Free: 3.7 pg/mL (ref 2.0–4.4)

## 2022-04-20 MED ORDER — ATORVASTATIN CALCIUM 80 MG PO TABS: 80.0000 mg | ORAL_TABLET | Freq: Every day | ORAL | 1 refills | Status: DC

## 2022-04-24 ENCOUNTER — Ambulatory Visit (INDEPENDENT_AMBULATORY_CARE_PROVIDER_SITE_OTHER): Payer: Self-pay | Admitting: Nurse Practitioner

## 2022-04-24 ENCOUNTER — Encounter: Payer: Self-pay | Admitting: Nurse Practitioner

## 2022-04-24 VITALS — BP 127/77 | HR 81 | Temp 97.6°F | Resp 20 | Ht 65.0 in | Wt 215.0 lb

## 2022-04-24 DIAGNOSIS — Z794 Long term (current) use of insulin: Secondary | ICD-10-CM

## 2022-04-24 DIAGNOSIS — J069 Acute upper respiratory infection, unspecified: Secondary | ICD-10-CM

## 2022-04-24 DIAGNOSIS — E1165 Type 2 diabetes mellitus with hyperglycemia: Secondary | ICD-10-CM

## 2022-04-24 MED ORDER — BENZONATATE 100 MG PO CAPS: 100.0000 mg | ORAL_CAPSULE | Freq: Three times a day (TID) | ORAL | 0 refills | Status: DC | PRN

## 2022-04-24 MED ORDER — AZITHROMYCIN 250 MG PO TABS: ORAL_TABLET | ORAL | 0 refills | Status: DC

## 2022-04-24 NOTE — Patient Instructions (Signed)

## 2022-04-24 NOTE — Progress Notes (Signed)
Subjective:    Patient ID: Rebecca Garner, adult    DOB: 1975-08-06, 47 y.o.   MRN: [id number]   Chief Complaint: Cough, Nasal Congestion, and Toenails dark and painful (Both big toes) Patient comes in today with 2 complaints:  Cough This is a new problem. The current episode started in the past 7 days. The problem has been gradually worsening. The problem occurs constantly. The cough is Productive of sputum. Associated symptoms include ear congestion, rhinorrhea and a sore throat. Pertinent negatives include no fever. Nothing aggravates the symptoms. She has tried OTC cough suppressant for the symptoms. The treatment provided mild relief.  Covid test negative  Toenails Dark and sore to touch. Started several months ago.  -  Patient Active Problem List   Diagnosis Date Noted   Synovitis of right knee 06/29/2021   Moderate recurrent major depression ([place]) 02/01/2021   OSA (obstructive sleep apnea) 10/19/2020   Morbid obesity ([place]) 07/25/2020   Prediabetes 05/02/2020   Overactive bladder 02/01/2020   Pain in left knee 12/24/2019   Mixed hyperlipidemia 04/17/2019   Vitamin D deficiency 07/01/2017   Anxiety 05/16/2017   Primary hypertension 05/16/2017       Review of Systems  Constitutional:  Negative for fever.  HENT:  Positive for rhinorrhea and sore throat.   Respiratory:  Positive for cough.        Objective:   Physical Exam Vitals and nursing note reviewed.  Constitutional:      General: She is not in acute distress.    Appearance: Normal appearance. She is well-developed.  HENT:     Head: Normocephalic.     Right Ear: Tympanic membrane normal.     Left Ear: Tympanic membrane normal.     Nose: Congestion and rhinorrhea present.     Mouth/Throat:     Mouth: Mucous membranes are moist.  Eyes:     Pupils: Pupils are equal, round, and reactive to light.  Neck:     Vascular: No carotid bruit or JVD.  Cardiovascular:     Rate and Rhythm: Normal rate and regular  rhythm.     Heart sounds: Normal heart sounds.  Pulmonary:     Effort: Pulmonary effort is normal. No respiratory distress.     Breath sounds: Wheezing (exp wheezes throughout) present. No rales.  Chest:     Chest wall: No tenderness.  Abdominal:     General: Bowel sounds are normal. There is no distension or abdominal bruit.     Palpations: Abdomen is soft. There is no hepatomegaly, splenomegaly, mass or pulsatile mass.     Tenderness: There is no abdominal tenderness.  Musculoskeletal:        General: Normal range of motion.     Cervical back: Normal range of motion and neck supple.  Lymphadenopathy:     Cervical: No cervical adenopathy.  Skin:    General: Skin is warm and dry.  Neurological:     Mental Status: She is alert and oriented to person, place, and time.     Deep Tendon Reflexes: Reflexes are normal and symmetric.  Psychiatric:        Behavior: Behavior normal.        Thought Content: Thought content normal.        Judgment: Judgment normal.    BP 127/77   Pulse 81   Temp 97.6 F (36.4 C) (Temporal)   Resp 20   Ht 5' 5"$  (1.651 m)   Wt 215 lb (97.5 kg)  SpO2 92%   BMI 35.78 kg/m         Assessment & Plan:   Rebecca Garner in today with chief complaint of Cough, Nasal Congestion, and Toenails dark and painful (Both big toes)   1. Type 2 diabetes mellitus with hyperglycemia, with long-term current use of insulin [facility]) referral - Ambulatory referral to Podiatry  2. URI with cough and congestion 1. Take meds as prescribed 2. Use a cool mist humidifier especially during the winter months and when heat has been humid. 3. Use saline nose sprays frequently 4. Saline irrigations of the nose can be very helpful if done frequently.  * 4X daily for 1 week*  * Use of a nettie pot can be helpful with this. Follow directions with this* 5. Drink plenty of fluids 6. Keep thermostat turn down low 7.For any cough or congestion- tessalon perles 8. For fever or aces  or pains- take tylenol or ibuprofen appropriate for age and weight.  * for fevers greater than 101 orally you may alternate ibuprofen and tylenol every  3 hours.    Meds ordered this encounter  Medications   azithromycin (ZITHROMAX Z-PAK) 250 MG tablet    Sig: As directed    Dispense:  6 tablet    Refill:  0    Order Specific Question:   Supervising Provider    Answer:   Caryl Pina A [id number]   benzonatate (TESSALON PERLES) 100 MG capsule    Sig: Take 1 capsule (100 mg total) by mouth 3 (three) times daily as needed for cough.    Dispense:  20 capsule    Refill:  0    Order Specific Question:   Supervising Provider    Answer:   Caryl Pina A [id number]      The above assessment and management plan was discussed with the patient. The patient verbalized understanding of and has agreed to the management plan. Patient is aware to call the clinic if symptoms persist or worsen. Patient is aware when to return to the clinic for a follow-up visit. Patient educated on when it is appropriate to go to the emergency department.   Mary-Margaret Hassell Done, FNP

## 2022-05-16 LAB — COLOGUARD: COLOGUARD: NEGATIVE

## 2022-05-24 ENCOUNTER — Ambulatory Visit: Payer: Self-pay | Admitting: Nutrition

## 2022-05-31 ENCOUNTER — Encounter: Payer: Self-pay | Admitting: Family Medicine

## 2022-06-06 ENCOUNTER — Other Ambulatory Visit: Payer: Self-pay | Admitting: Family Medicine

## 2022-06-06 DIAGNOSIS — Z794 Long term (current) use of insulin: Secondary | ICD-10-CM

## 2022-06-06 MED ORDER — FREESTYLE LIBRE 3 SENSOR MISC: 12 refills | Status: AC

## 2022-06-14 ENCOUNTER — Telehealth: Payer: Self-pay | Admitting: Family Medicine

## 2022-06-14 NOTE — Telephone Encounter (Signed)
Patient calling back because she had not heard back. Please call.

## 2022-06-14 NOTE — Telephone Encounter (Signed)
PT r/c 

## 2022-06-25 ENCOUNTER — Ambulatory Visit: Payer: Self-pay | Admitting: Nutrition

## 2022-06-26 NOTE — Telephone Encounter (Signed)
I spoke with pt and she says sometimes her Rebecca Garner reading is not the same as her fingerstick glucometer. She said the numbers will only be 15-20 off so I advised pt that can be normal and if she is ever worried or feels funny to always do a fingerstick and use that reading. Pt voiced understanding.

## 2022-07-26 ENCOUNTER — Ambulatory Visit: Payer: Self-pay | Admitting: Nutrition

## 2022-07-27 ENCOUNTER — Encounter: Payer: Self-pay | Admitting: Family Medicine

## 2022-07-27 ENCOUNTER — Telehealth (INDEPENDENT_AMBULATORY_CARE_PROVIDER_SITE_OTHER): Payer: Self-pay | Admitting: Family Medicine

## 2022-07-27 DIAGNOSIS — G629 Polyneuropathy, unspecified: Secondary | ICD-10-CM

## 2022-07-27 NOTE — Progress Notes (Signed)
Virtual Visit via MyChart video note  I connected with Rebecca Garner on 07/27/22 at 1334 by video and verified that I am speaking with the correct person using two identifiers. [name] is currently located at home and patient are currently with her during visit. The provider, Elige Radon Jhovanny Guinta, MD is located in their office at time of visit.  Call ended at 1344  I discussed the limitations, risks, security and privacy concerns of performing an evaluation and management service by video and the availability of in person appointments. I also discussed with the patient that there may be a patient responsible charge related to this service. The patient expressed understanding and agreed to proceed.   History and Present Illness: Patient is calling in for tingling in her feet and legs for the past 2 weeks.  This is the first time its lasted this long.  It is worse at night. She does have type 2 diabetes. She denies any major back issues. She has tingling in both legs below the knees to feet.  She denies any rash. She denies any new swelling. Her blood sugar has been elevated more in low 200s recently and occasionally over 300.   1. Neuropathy     Outpatient Encounter Medications as of 07/27/2022  Medication Sig   Continuous Blood Gluc Sensor (FREESTYLE LIBRE 3 SENSOR) MISC Place 1 sensor on the skin every 14 days. Use to check glucose continuously   albuterol (VENTOLIN HFA) 108 (90 Base) MCG/ACT inhaler Inhale 2 puffs into the lungs every 6 (six) hours as needed for wheezing or shortness of breath.   atenolol (TENORMIN) 25 MG tablet Take 1 tablet (25 mg total) by mouth daily.   atorvastatin (LIPITOR) 80 MG tablet Take 1 tablet (80 mg total) by mouth daily.   azithromycin (ZITHROMAX Z-PAK) 250 MG tablet As directed   benzonatate (TESSALON PERLES) 100 MG capsule Take 1 capsule (100 mg total) by mouth 3 (three) times daily as needed for cough.   Blood Glucose Monitoring Suppl DEVI 1 each by  Does not apply route in the morning, at noon, and at bedtime. May substitute to any manufacturer covered by patient's insurance.   busPIRone (BUSPAR) 10 MG tablet Take 1 tablet (10 mg total) by mouth 2 (two) times daily.   escitalopram (LEXAPRO) 10 MG tablet Take 1 tablet (10 mg total) by mouth daily.   fluticasone (FLONASE) 50 MCG/ACT nasal spray Place 2 sprays into both nostrils daily.   glipiZIDE (GLUCOTROL XL) 5 MG 24 hr tablet Take 1 tablet (5 mg total) by mouth daily with breakfast.   hydrOXYzine (ATARAX) 10 MG tablet Take 1 tablet (10 mg total) by mouth 3 (three) times daily as needed for anxiety.   meloxicam (MOBIC) 15 MG tablet Take 1 tablet (15 mg total) by mouth daily.   olmesartan (BENICAR) 20 MG tablet Take 1 tablet (20 mg total) by mouth daily.   tolterodine (DETROL) 2 MG tablet Take 1 tablet (2 mg total) by mouth 2 (two) times daily.   No facility-administered encounter medications on file as of 07/27/2022.    Review of Systems  Constitutional:  Negative for chills and fever.  Eyes:  Negative for visual disturbance.  Respiratory:  Negative for shortness of breath and wheezing.   Cardiovascular:  Negative for chest pain and leg swelling.  Skin:  Negative for rash.  Neurological:  Positive for numbness. Negative for dizziness, weakness and light-headedness.  All other systems reviewed and are negative.   Observations/Objective:  Patient sounds comfortable and in no acute distress  Assessment and Plan: Problem List Items Addressed This Visit   None Visit Diagnoses     Neuropathy    -  Primary   Relevant Orders   CBC with Differential/Platelet   CMP14+EGFR   TSH   Vitamin B12   Folate       Will do testing for possible neuropathy but there are some acute blood sugars are running up so it is likely diabetic neuropathy and we discussed the possibility of gabapentin in the future but I want to do the testing especially for thyroid and B12 to make sure that there is no  other issues. Follow up plan: Return if symptoms worsen or fail to improve.     I discussed the assessment and treatment plan with the patient. The patient was provided an opportunity to ask questions and all were answered. The patient agreed with the plan and demonstrated an understanding of the instructions.   The patient was advised to call back or seek an in-person evaluation if the symptoms worsen or if the condition fails to improve as anticipated.  The above assessment and management plan was discussed with the patient. The patient verbalized understanding of and has agreed to the management plan. Patient is aware to call the clinic if symptoms persist or worsen. Patient is aware when to return to the clinic for a follow-up visit. Patient educated on when it is appropriate to go to the emergency department.    I provided 10 minutes of non-face-to-face time during this encounter.    Nils Pyle, MD

## 2022-07-31 ENCOUNTER — Other Ambulatory Visit: Payer: Self-pay

## 2022-08-01 ENCOUNTER — Other Ambulatory Visit: Payer: Self-pay

## 2022-08-01 DIAGNOSIS — G629 Polyneuropathy, unspecified: Secondary | ICD-10-CM

## 2022-08-01 LAB — CMP14+EGFR

## 2022-08-01 LAB — CBC WITH DIFFERENTIAL/PLATELET
Basophils Absolute: 0 10*3/uL (ref 0.0–0.2)
Eos: 1 %
Monocytes: 6 %
Neutrophils Absolute: 4.4 10*3/uL (ref 1.4–7.0)
Platelets: 179 10*3/uL (ref 150–450)
RDW: 12.9 % (ref 11.7–15.4)

## 2022-08-02 LAB — CBC WITH DIFFERENTIAL/PLATELET
Basos: 0 %
EOS (ABSOLUTE): 0.1 10*3/uL (ref 0.0–0.4)
Hematocrit: 40.6 % (ref 34.0–46.6)
Hemoglobin: 13.3 g/dL (ref 11.1–15.9)
Immature Grans (Abs): 0 10*3/uL (ref 0.0–0.1)
Immature Granulocytes: 0 %
Lymphocytes Absolute: 4.3 10*3/uL — ABNORMAL HIGH (ref 0.7–3.1)
Lymphs: 46 %
MCH: 30.3 pg (ref 26.6–33.0)
MCHC: 32.8 g/dL (ref 31.5–35.7)
MCV: 93 fL (ref 79–97)
Monocytes Absolute: 0.6 10*3/uL (ref 0.1–0.9)
Neutrophils: 47 %
RBC: 4.39 x10E6/uL (ref 3.77–5.28)
WBC: 9.5 10*3/uL (ref 3.4–10.8)

## 2022-08-02 LAB — CMP14+EGFR
Albumin/Globulin Ratio: 1.5 (ref 1.2–2.2)
Albumin: 4.1 g/dL (ref 3.9–4.9)
Alkaline Phosphatase: 121 IU/L (ref 44–121)
BUN/Creatinine Ratio: 16 (ref 9–23)
Bilirubin Total: <0.2 mg/dL (ref 0.0–1.2)
Calcium: 9.3 mg/dL (ref 8.7–10.2)
Glucose: 97 mg/dL (ref 70–99)
Potassium: 4.2 mmol/L (ref 3.5–5.2)
eGFR: 69 mL/min/{1.73_m2} (ref >59)

## 2022-08-02 LAB — FOLATE: Folate: 3.1 ng/mL (ref >3.0)

## 2022-08-02 LAB — VITAMIN B12: Vitamin B-12: 354 pg/mL (ref 232–1245)

## 2022-08-02 LAB — TSH: TSH: 0.586 u[IU]/mL (ref 0.450–4.500)

## 2022-08-03 ENCOUNTER — Telehealth: Payer: Self-pay

## 2022-08-03 NOTE — Telephone Encounter (Signed)
Hold glipizide until appt on Monday.

## 2022-08-03 NOTE — Telephone Encounter (Signed)
I spoke to pt and she said her Blood Sugar was 88 a few minutes ago and didn't know what to do to get her sugars up. Pt has not had anything to eat since breakfast this morning around 9. Advised pt she should eat small frequent meals with protein to help stabilize her blood sugars and pt voiced understanding.

## 2022-08-03 NOTE — Telephone Encounter (Signed)
Patient calls to report that she is having issues almost daily with her blood sugar dropping around noon to somewhere around 60.  She reports she is eating in the mornings and taking her Glipizide.  This morning she had two waffles, eggs, and sausage.  She started feeling badly a while ago and checked blood sugar at 60.  She does have a follow up appointment scheduled with you on Monday but wants to know what is recommended in the meantime.

## 2022-08-06 ENCOUNTER — Encounter: Payer: Self-pay | Admitting: Family Medicine

## 2022-08-06 ENCOUNTER — Ambulatory Visit (INDEPENDENT_AMBULATORY_CARE_PROVIDER_SITE_OTHER): Payer: Self-pay | Admitting: Family Medicine

## 2022-08-06 VITALS — BP 138/84 | HR 81 | Temp 98.2°F | Ht 65.0 in | Wt 230.0 lb

## 2022-08-06 DIAGNOSIS — I152 Hypertension secondary to endocrine disorders: Secondary | ICD-10-CM

## 2022-08-06 DIAGNOSIS — Z6838 Body mass index (BMI) 38.0-38.9, adult: Secondary | ICD-10-CM

## 2022-08-06 DIAGNOSIS — E1159 Type 2 diabetes mellitus with other circulatory complications: Secondary | ICD-10-CM

## 2022-08-06 DIAGNOSIS — F331 Major depressive disorder, recurrent, moderate: Secondary | ICD-10-CM

## 2022-08-06 DIAGNOSIS — E1169 Type 2 diabetes mellitus with other specified complication: Secondary | ICD-10-CM

## 2022-08-06 DIAGNOSIS — Z794 Long term (current) use of insulin: Secondary | ICD-10-CM

## 2022-08-06 DIAGNOSIS — E1165 Type 2 diabetes mellitus with hyperglycemia: Secondary | ICD-10-CM

## 2022-08-06 DIAGNOSIS — E785 Hyperlipidemia, unspecified: Secondary | ICD-10-CM

## 2022-08-06 DIAGNOSIS — F411 Generalized anxiety disorder: Secondary | ICD-10-CM | POA: Insufficient documentation

## 2022-08-06 LAB — BAYER DCA HB A1C WAIVED: HB A1C (BAYER DCA - WAIVED): 6.1 % — ABNORMAL HIGH (ref 4.8–5.6)

## 2022-08-06 MED ORDER — ATENOLOL 25 MG PO TABS
25.0000 mg | ORAL_TABLET | Freq: Every day | ORAL | 3 refills | Status: AC
Start: 2022-08-06 — End: ?

## 2022-08-06 MED ORDER — ATORVASTATIN CALCIUM 80 MG PO TABS: 80.0000 mg | ORAL_TABLET | Freq: Every day | ORAL | 3 refills | Status: AC

## 2022-08-06 MED ORDER — TRULICITY 1.5 MG/0.5ML ~~LOC~~ SOAJ: 1.5000 mg | SUBCUTANEOUS | 11 refills | Status: DC

## 2022-08-06 NOTE — Progress Notes (Signed)
Established Patient Office Visit  Subjective   Patient ID: Rebecca Garner, adult    DOB: January 03, 1976  Age: 47 y.o. MRN: [id number]  Chief Complaint  Patient presents with   Medical Management of Chronic Issues   Diabetes    HPI T2DM Pt presents for initial evaluation of Type 2 diabetes mellitus. Current symptoms include polyuria. Patient denies foot ulcerations, increased appetite, nausea, paresthesia of the feet, polydipsia, polyuria, visual disturbances, vomiting, and weight loss.  Current diabetic medications include glipizide. She stopped this recently due to hypoglycemia Compliant with meds - Yes  Current monitoring regimen: occasionally. She has been having lows around lunch time. Blood sugars have been around 200 after eating.   Current diet:  regular Current exercise: none  Urine microalbumin UTD? No  Is She on ACE inhibitor or angiotensin II receptor blocker?  Yes, olmesartan Is She on statin? Yes atorvastatin  2. HTN Complaint with meds - Yes Current Medications - atenolol 25 mg, olmesartan 20 mg  Checking BP at home- no Pertinent ROS:  Headache - No Fatigue - No Visual Disturbances - No Chest pain - No Dyspnea - No Palpitations - No LE edema - No  3. HLD On atorvastatin. Regular diet, no exercise.   5. Anxiety/depression Currently on lexapro and buspar BID. Reports compliance with medications. Reports fairly well controlled and she doesn't desire a change in regimen currently.       08/06/2022   10:52 AM 04/24/2022   12:43 PM 04/19/2022    9:21 AM  Depression screen PHQ 2/9  Decreased Interest 2 1 1   Down, Depressed, Hopeless 1 1 1   PHQ - 2 Score 3 2 2   Altered sleeping 2 1 1   Tired, decreased energy 3 1 1   Change in appetite 1 1 1   Feeling bad or failure about yourself  0 1 1  Trouble concentrating 0 1 1  Moving slowly or fidgety/restless 0 1 1  Suicidal thoughts 0 0 0  PHQ-9 Score 9 8 8   Difficult doing work/chores Somewhat difficult Somewhat  difficult Somewhat difficult      08/06/2022   10:53 AM 04/24/2022   12:43 PM 04/19/2022    9:22 AM 09/26/2021    2:48 PM  GAD 7 : Generalized Anxiety Score  Nervous, Anxious, on Edge 2 1 1 1   Control/stop worrying 2 1 1 1   Worry too much - different things 1 1 1 1   Trouble relaxing 2 1 1 1   Restless 2 1 1 1   Easily annoyed or irritable 1 1 1 1   Afraid - awful might happen 2 1 1 1   Total GAD 7 Score 12 7 7 7   Anxiety Difficulty Somewhat difficult Somewhat difficult Somewhat difficult Somewhat difficult     Past Medical History:  Diagnosis Date   Anxiety    Hypertension    Mixed hyperlipidemia 04/17/2019   Prediabetes 05/02/2020   Vitamin D deficiency 07/01/2017      ROS As per HPI.    Objective:     BP 138/84   Pulse 81   Temp 98.2 F (36.8 C) (Temporal)   Ht 5\' 5"  (1.651 m)   Wt 230 lb (104.3 kg)   SpO2 96%   BMI 38.27 kg/m  Wt Readings from Last 3 Encounters:  08/06/22 230 lb (104.3 kg)  04/24/22 215 lb (97.5 kg)  04/19/22 222 lb 2 oz (100.8 kg)     Physical Exam Vitals and nursing note reviewed.  Constitutional:  General: She is not in acute distress.    Appearance: She is obese. She is not ill-appearing, toxic-appearing or diaphoretic.  Cardiovascular:     Rate and Rhythm: Normal rate and regular rhythm.     Heart sounds: Normal heart sounds. No murmur heard. Pulmonary:     Effort: Pulmonary effort is normal.     Breath sounds: Normal breath sounds.  Abdominal:     General: Bowel sounds are normal. There is no distension.     Palpations: Abdomen is soft.     Tenderness: There is no abdominal tenderness. There is no guarding.  Musculoskeletal:     Cervical back: Neck supple. No rigidity.     Right lower leg: No edema.     Left lower leg: No edema.  Skin:    General: Skin is warm and dry.  Neurological:     General: No focal deficit present.     Mental Status: She is alert and oriented to person, place, and time.  Psychiatric:        Mood  and Affect: Mood normal.        Behavior: Behavior normal.      No results found for any visits on 08/06/22.    The 10-year ASCVD risk score (Arnett DK, et al., 2019) is: 26.4%    Assessment & Plan:   Rebecca Garner was seen today for medical management of chronic issues and diabetes.  Diagnoses and all orders for this visit:  Type 2 diabetes mellitus with hyperglycemia, with long-term current use of insulin ([facility]) A1c 6.1 today, at goal of <7. Medication changes today: discontinue glipizde due to hypoglycemia. Will start trulicity as below. She is on an ACE/ARB and statin. Eye exam: reminded to scheduled. Foot exam: has been referred to podiatry. Urine micro: UTD. Diet and exercise.  -     Bayer DCA Hb A1c Waived -     Dulaglutide (TRULICITY) 1.5 MG/0.5ML SOPN; Inject 1.5 mg into the skin once a week.  Hypertension associated with type 2 diabetes mellitus (HCC) Well controlled on current regimen. On ARB as well. -     atenolol (TENORMIN) 25 MG tablet; Take 1 tablet (25 mg total) by mouth daily.  Hyperlipidemia associated with type 2 diabetes mellitus ([facility]CC) Lipid panel pending. On statin.  -     Lipid panel -     atorvastatin (LIPITOR) 80 MG tablet; Take 1 tablet (80 mg total) by mouth daily.  Moderate recurrent major depression ([facility]CC) Generalized anxiety disorder Well controlled on current regimen.   Morbid obesity ([facility]CC) Gain since last visit. Hopefully Trulicity will assist with weight loss. Diet and exercise.     Return in about 3 months (around 11/06/2022).   The patient indicates understanding of these issues and agrees with the plan.  Gabriel Earing, FNP

## 2022-08-07 LAB — LIPID PANEL
Chol/HDL Ratio: 3.6 ratio (ref 0.0–4.4)
Cholesterol, Total: 135 mg/dL (ref 100–199)
HDL: 37 mg/dL — ABNORMAL LOW (ref >39)
LDL Chol Calc (NIH): 73 mg/dL (ref 0–99)
Triglycerides: 143 mg/dL (ref 0–149)
VLDL Cholesterol Cal: 25 mg/dL (ref 5–40)

## 2022-08-08 ENCOUNTER — Telehealth: Payer: Self-pay | Admitting: Family Medicine

## 2022-08-08 ENCOUNTER — Other Ambulatory Visit: Payer: Self-pay | Admitting: Family Medicine

## 2022-08-08 DIAGNOSIS — E1165 Type 2 diabetes mellitus with hyperglycemia: Secondary | ICD-10-CM

## 2022-08-08 MED ORDER — SEMAGLUTIDE(0.25 OR 0.5MG/DOS) 2 MG/3ML ~~LOC~~ SOPN
PEN_INJECTOR | SUBCUTANEOUS | 6 refills | Status: DC
Start: 2022-08-08 — End: 2022-08-30

## 2022-08-08 NOTE — Telephone Encounter (Signed)
Pt called to let PCP know that insurance is denying her Trulicity Rx. Needs advise on other options/plans.

## 2022-08-08 NOTE — Telephone Encounter (Signed)
I've sent ozempic instead.

## 2022-08-08 NOTE — Telephone Encounter (Signed)
Insurance does not start till next month per Tiffany okay to wait and start then.

## 2022-08-08 NOTE — Telephone Encounter (Signed)
TRULICITY 0.75 MG/0.5ML SOPN        Changed from: Semaglutide,0.25 or 0.5MG /DOS, 2 MG/3ML SOPN    Pharmacy comment: Alternative Requested:NOT COVERED BY INSURANCE.

## 2022-08-08 NOTE — Telephone Encounter (Signed)
Please advise 

## 2022-08-08 NOTE — Telephone Encounter (Signed)
She would have to start with the 0.25 mg dosage. She can restart glipizide until insurance kicks in- she just needs to make sure that she is eating regularly to prevent hypoglycemia.

## 2022-08-10 NOTE — Telephone Encounter (Signed)
Patient would like to speak to nurse about medication.   She wanted to let us know that she is having pain in her knee and swelling in her ankles again. Stated that it had happened last year. Please call back to advise.

## 2022-08-10 NOTE — Telephone Encounter (Signed)
Patient aware we can not tell her it is okay to take someone else's medications. She verbalized understanding. Patient stated her ankles or swollen no pain. Advised patient to elevate feet when sitting or laying she verbalized under standing.

## 2022-08-22 ENCOUNTER — Telehealth: Payer: Self-pay | Admitting: Family Medicine

## 2022-08-22 ENCOUNTER — Other Ambulatory Visit: Payer: Self-pay | Admitting: Family Medicine

## 2022-08-22 DIAGNOSIS — Z794 Long term (current) use of insulin: Secondary | ICD-10-CM

## 2022-08-22 NOTE — Telephone Encounter (Signed)
  TRULICITY 0.75 MG/0.5ML SOPN        Changed from: Semaglutide,0.25 or 0.5MG /DOS, 2 MG/3ML SOPN   Pharmacy comment: Alternative Requested:INSURANCE DOES NOT COVER THIS MEDICINE.

## 2022-08-22 NOTE — Telephone Encounter (Signed)
Rebecca Garner, there are two boxes in [employer] office fridge, but I'm not sure we can use these.  Will you take a look and let patient know please.

## 2022-08-23 NOTE — Telephone Encounter (Signed)
PA PENDING FOR OZEMPIC

## 2022-08-28 ENCOUNTER — Telehealth: Payer: Self-pay

## 2022-08-28 NOTE — Telephone Encounter (Signed)
Discontinue the glipizide. Continue the semaglutide

## 2022-08-28 NOTE — Telephone Encounter (Signed)
Patient aware.

## 2022-08-28 NOTE — Telephone Encounter (Signed)
Patient saw Tiffany on 08/06/22 for diabetes and had been having some hypoglycemic episodes on Glipizide.  Patient is now taking Semaglutide 0.25 weekly.  She took her injection on Saturday and this morning sugar dropped to 60.  Patient ate a banana and sugar recovered to 180 but patient is still feeling very shaky.  I advised her to drink a small amount of orange juice.  Patient would like to know what she should do after this and what is recommended.

## 2022-08-30 ENCOUNTER — Telehealth: Payer: Self-pay | Admitting: Pharmacist

## 2022-08-30 ENCOUNTER — Other Ambulatory Visit: Payer: Self-pay | Admitting: Pharmacist

## 2022-08-30 DIAGNOSIS — Z794 Long term (current) use of insulin: Secondary | ICD-10-CM

## 2022-08-30 MED ORDER — TRULICITY 1.5 MG/0.5ML ~~LOC~~ SOPN
1.5000 mg | PEN_INJECTOR | SUBCUTANEOUS | 5 refills | Status: AC
Start: 2022-08-30 — End: ?

## 2022-08-30 NOTE — Telephone Encounter (Signed)
Patient Advocate Encounter  Prior Authorization for Trulicity 1.5MG /0.5ML pen-injectors has been approved with [facility].    PA# [id number] Effective dates: 08/30/22 through 08/30/23

## 2022-08-30 NOTE — Telephone Encounter (Signed)
Cathy & Tiffany, have either seen this paperwork for jury duty?

## 2022-08-30 NOTE — Telephone Encounter (Signed)
Ozempic denied Prefers trulicity Patient called New RX sent to [facility] Denies personal and family history of Medullary thyroid cancer (MTC)

## 2022-08-30 NOTE — Progress Notes (Signed)
08/30/2022 Name: Rebecca Garner MRN: [id number] DOB: 04-28-1975  Chief Complaint  Patient presents with   Diabetes    Rebecca Garner is a 47 y.o. year old adult who presented for a telephone visit.   They were referred to the pharmacist by their PCP for assistance in managing diabetes and medication access.    Subjective:  Care Team: Primary Care Provider: Gabriel Earing, FNP ; Next Scheduled Visit: 11/07/22  Medication Access/Adherence  Current Pharmacy:  [facility] [phone] - [place], [place] - [address] [place] [address] Phone: [phone] Fax: [phone]   Patient reports affordability concerns with their medications: No  Patient reports access/transportation concerns to their pharmacy: No  Patient reports adherence concerns with their medications:  No     Diabetes:  Current medications: ozempic samples-->trulicity preferred on insurance Medications tried in the past: metformin, glipizide Denies personal and family history of Medullary thyroid cancer (MTC)  Current glucose readings: low to 60s (not eating), PPBG  up in the 200s at times  Patient reports hypoglycemic s/sx including dizziness, shakiness, sweating. Patient denies hyperglycemic symptoms including polyuria, polydipsia, polyphagia, nocturia, neuropathy, blurred vision.  Current meal patterns:  Discussed meal planning options and Plate method for healthy eating Avoid sugary drinks and desserts Incorporate balanced protein, non starchy veggies, 1 serving of carbohydrate with each meal Increase water intake Increase physical activity as able  Current physical activity: encouraged as able  Current medication access support: aetna   Objective:  Lab Results  Component Value Date   HGBA1C 6.1 (H) 08/06/2022    Lab Results  Component Value Date   CREATININE 1.00 08/01/2022   BUN 16 08/01/2022   NA 140 08/01/2022   K 4.2 08/01/2022   CL 105 08/01/2022   CO2  21 08/01/2022    Lab Results  Component Value Date   CHOL 135 08/06/2022   HDL 37 (L) 08/06/2022   LDLCALC 73 08/06/2022   TRIG 143 08/06/2022   CHOLHDL 3.6 08/06/2022    Medications Reviewed Today     Reviewed by Gabriel Earing, FNP (Family Nurse Practitioner) on 08/06/22 at 1126  Med List Status: <None>   Medication Order Taking? Sig Documenting Provider Last Dose Status Informant  albuterol (VENTOLIN HFA) 108 (90 Base) MCG/ACT inhaler [id number] Yes Inhale 2 puffs into the lungs every 6 (six) hours as needed for wheezing or shortness of breath. Gwenlyn Fudge, FNP Taking Active   atenolol (TENORMIN) 25 MG tablet [id number]  Take 1 tablet (25 mg total) by mouth daily. Gabriel Earing, FNP  Active   atorvastatin (LIPITOR) 80 MG tablet [id number]  Take 1 tablet (80 mg total) by mouth daily. Gabriel Earing, FNP  Active   Blood Glucose Monitoring Suppl DEVI [id number] Yes 1 each by Does not apply route in the morning, at noon, and at bedtime. May substitute to any manufacturer covered by patient's insurance. Gabriel Earing, FNP Taking Active   busPIRone (BUSPAR) 10 MG tablet [id number] Yes Take 1 tablet (10 mg total) by mouth 2 (two) times daily. Gabriel Earing, FNP Taking Active   Continuous Blood Gluc Sensor (FREESTYLE LIBRE 3 SENSOR) [place] [id number] No Place 1 sensor on the skin every 14 days. Use to check glucose continuously  Patient not taking: Reported on 08/06/2022   Gabriel Earing, FNP Not Taking Active   Dulaglutide (TRULICITY) 1.5 MG/0.5ML SOPN [id number] Yes Inject 1.5 mg into the skin once a week. Gabriel Earing, FNP  Active   escitalopram (LEXAPRO) 10 MG tablet [id number] Yes Take 1 tablet (10 mg total) by mouth daily. Gabriel Earing, FNP Taking Active   fluticasone [facility]) 50 MCG/ACT nasal spray [id number] Yes Place 2 sprays into both nostrils daily. Daryll Drown, NP Taking Active   glipiZIDE (GLUCOTROL XL) 5 MG 24 hr tablet [id number] No Take 1 tablet (5 mg  total) by mouth daily with breakfast.  Patient not taking: Reported on 08/06/2022   Gabriel Earing, FNP Not Taking Active   hydrOXYzine (ATARAX) 10 MG tablet [id number] Yes Take 1 tablet (10 mg total) by mouth 3 (three) times daily as needed for anxiety. Gabriel Earing, FNP Taking Active   meloxicam [facility]) 15 MG tablet [id number] Yes Take 1 tablet (15 mg total) by mouth daily. Gwenlyn Fudge, FNP Taking Active   olmesartan (BENICAR) 20 MG tablet [id number] Yes Take 1 tablet (20 mg total) by mouth daily. Gabriel Earing, FNP Taking Active   tolterodine (DETROL) 2 MG tablet [id number] Yes Take 1 tablet (2 mg total) by mouth 2 (two) times daily. Gabriel Earing, FNP Taking Active              Assessment/Plan:   Diabetes: - Currently uncontrolled - Reviewed long term cardiovascular and renal outcomes of uncontrolled blood sugar - Reviewed goal A1c, goal fasting, and goal 2 hour post prandial glucose - Recommend to transition to trulicity after finishing ozempic samples Trulicity preferred on insurance  Instructed patient to go online to get Trulicity copay card Counseled patient to eat 3 steady meals/daily (healthy plate method) Continue water intake Hypoglycemia precautions  - Recommend to check glucose daily (fasting) or if symptomatic   Follow Up Plan: PCP 11/07/22; reach out to PCP sooner if blood sugar remains uncontrolled  Kieth Brightly, PharmD, BCACP Clinical Pharmacist, [facility]

## 2022-08-30 NOTE — Telephone Encounter (Signed)
Patient calling to ask about jury duty papers she dropped off last week.  Jury duty scheduled for 09/10/22 and she needs papers signed/returned; please call patient and advise

## 2022-08-30 NOTE — Telephone Encounter (Signed)
Letter written and signed. Sent up front for pick up

## 2022-08-31 NOTE — Telephone Encounter (Signed)
Pt aware faxed to [phone] and placed up front for her to pick up.

## 2022-10-09 ENCOUNTER — Ambulatory Visit: Payer: 59 | Admitting: Nurse Practitioner

## 2022-10-10 ENCOUNTER — Encounter: Payer: Self-pay | Admitting: Family Medicine

## 2022-10-29 ENCOUNTER — Encounter: Payer: Self-pay | Admitting: Nurse Practitioner

## 2022-10-29 ENCOUNTER — Ambulatory Visit (INDEPENDENT_AMBULATORY_CARE_PROVIDER_SITE_OTHER): Payer: 59 | Admitting: Nurse Practitioner

## 2022-10-29 VITALS — BP 130/83 | HR 91 | Temp 97.0°F | Ht 65.0 in | Wt 223.8 lb

## 2022-10-29 DIAGNOSIS — B354 Tinea corporis: Secondary | ICD-10-CM | POA: Diagnosis not present

## 2022-10-29 MED ORDER — KETOCONAZOLE 2 % EX SHAM
1.0000 | MEDICATED_SHAMPOO | CUTANEOUS | 0 refills | Status: AC
Start: 2022-10-29 — End: ?

## 2022-10-29 MED ORDER — MICONAZOLE NITRATE 2 % EX CREA
1.0000 | TOPICAL_CREAM | Freq: Two times a day (BID) | CUTANEOUS | 0 refills | Status: AC
Start: 2022-10-29 — End: ?

## 2022-10-29 NOTE — Progress Notes (Signed)
Acute Office Visit  Subjective:     Patient ID: Rebecca Garner, adult    DOB: August 06, 1975, 47 y.o.   MRN: [id number]  Chief Complaint  Patient presents with   Rash    Has had a rash all over body for couple of weeks. Itches very badly and rash on stomach is sore    HPI Rebecca Garner is a 47 yrs female seen today as an acute visit concerns for generalized rash. Tinea corporis Rash: Patient complains of rash involving the generalized. Rash started 2 week ago. Appearance of rash is circular, with well-defined edges, and appears to be consistent with tinea corporis (ringworm). Despite . Rash has not changed over time Initial distribution: back, bilateral buttock, and generalized.  Discomfort associated with rash: is pruritic.  Associated symptoms: none. Denies: abdominal pain, arthralgia, fever, myalgia, nausea, sore throat, and vomiting. Patient has not had previous evaluation of rash. Patient has not had previous treatment. Patient has not had contacts with similar rash. Patient has not identified precipitant. Patient has not had new exposures (soaps, lotions, laundry detergents, foods, medications, plants, insects or animals.)   Active Ambulatory Problems    Diagnosis Date Noted   Anxiety 05/16/2017   Hypertension associated with type 2 diabetes mellitus (HCC) 05/16/2017   Vitamin D deficiency 07/01/2017   Hyperlipidemia associated with type 2 diabetes mellitus (HCC) 04/17/2019   Pain in left knee 12/24/2019   Overactive bladder 02/01/2020   Prediabetes 05/02/2020   Morbid obesity (HCC) 07/25/2020   OSA (obstructive sleep apnea) 10/19/2020   Moderate recurrent major depression (HCC) 02/01/2021   Synovitis of right knee 06/29/2021   Generalized anxiety disorder 08/06/2022   Tinea corporis 10/29/2022   Resolved Ambulatory Problems    Diagnosis Date Noted   No Resolved Ambulatory Problems   Past Medical History:  Diagnosis Date   Hypertension    Mixed hyperlipidemia 04/17/2019        10/29/2022    3:54 PM 08/06/2022   10:52 AM 04/24/2022   12:43 PM  PHQ9 SCORE ONLY  PHQ-9 Total Score 18 9 8     Review of Systems  Constitutional:  Negative for chills and fever.  Respiratory:  Negative for cough and shortness of breath.   Cardiovascular:  Negative for chest pain and leg swelling.  Gastrointestinal:  Negative for blood in stool, melena, nausea and vomiting.  Genitourinary:  Negative for flank pain, frequency, hematuria and urgency.  Musculoskeletal:  Negative for falls and myalgias.  Skin:  Positive for rash.  Neurological:  Negative for dizziness and headaches.  Endo/Heme/Allergies:  Negative for polydipsia. Does not bruise/bleed easily.  Psychiatric/Behavioral:  Negative for depression and suicidal ideas.    Negative unless indicated in HPI    Objective:    BP 130/83   Pulse 91   Temp (!) 97 F (36.1 C) (Temporal)   Ht 5\' 5"  (1.651 m)   Wt 223 lb 12.8 oz (101.5 kg)   SpO2 95%   BMI 37.24 kg/m  BP Readings from Last 3 Encounters:  10/29/22 130/83  08/06/22 138/84  04/24/22 127/77   Wt Readings from Last 3 Encounters:  10/29/22 223 lb 12.8 oz (101.5 kg)  08/06/22 230 lb (104.3 kg)  04/24/22 215 lb (97.5 kg)    Physical Exam Vitals and nursing note reviewed.  HENT:     Head: Normocephalic and atraumatic.  Cardiovascular:     Rate and Rhythm: Normal rate and regular rhythm.  Pulmonary:     Effort: Pulmonary effort is  normal.     Breath sounds: Normal breath sounds.  Musculoskeletal:        General: Normal range of motion.     Right lower leg: No edema.     Left lower leg: No edema.  Skin:    General: Skin is warm and dry.     Findings: Rash present. Rash is purpuric.  Neurological:     Mental Status: Mental status is at baseline.  Psychiatric:        Mood and Affect: Mood normal.        Behavior: Behavior normal.        Thought Content: Thought content normal.        Judgment: Judgment normal.    No results found for any visits on  10/29/22.    Assessment & Plan:  Tinea corporis -     Miconazole Nitrate; Apply 1 Application topically 2 (two) times daily.  Dispense: 28.35 g; Refill: 0 -     Ketoconazole; Apply 1 Application topically 2 (two) times a week.  Dispense: 120 mL; Refill: 0   Rebecca Garner is 47 yrs old [place] female, no acute distress Tinea corporis: ketoconazole shampoos use it as a body wash  Apply Miconazole cream on clean dry skin BID   Education - Keep the Area Clean and Dry: Wash the affected area daily with mild soap and water. Dry thoroughly, as fungi thrive in moist environments. - Avoid Scratching: Scratching can spread the infection and worsen symptoms. Keep nails trimmed to prevent inadvertent scratching. - Hygiene and Prevention: Do not share personal items like towels, clothing, or bedding. Wash and change bedding, towels, and clothing frequently. Use clean towels and avoid touching or scratching the rash. - Monitor and Follow Up:Watch for signs of improvement or worsening. If there is no improvement after completing the treatment, or if new areas of rash appear, call for an appointment    The above assessment and management plan was discussed with the patient. The patient verbalized understanding of and has agreed to the management plan. Patient is aware to call the clinic if they develop any new symptoms or if symptoms persist or worsen. Patient is aware when to return to the clinic for a follow-up visit. Patient educated on when it is appropriate to go to the emergency department.   Return for follow up with pcp as already scheduled.  Arrie Aran Santa Lighter, DNP [facility] [address] [place], [place] [address] [phone]

## 2022-10-31 ENCOUNTER — Telehealth: Payer: Self-pay | Admitting: Family Medicine

## 2022-10-31 NOTE — Telephone Encounter (Signed)
Pt called stating that she is almost out of the cream that she was prescribed for ring worm and says the cream is not working and she is itching all over and wants to know what else she can take to help?

## 2022-10-31 NOTE — Telephone Encounter (Signed)
Verbal from [facility]akes pt needs to be reevaluated - spoke with pt and new appt made

## 2022-11-02 ENCOUNTER — Ambulatory Visit: Payer: 59 | Admitting: Family Medicine

## 2022-11-02 ENCOUNTER — Other Ambulatory Visit: Payer: Self-pay | Admitting: Nurse Practitioner

## 2022-11-02 DIAGNOSIS — B354 Tinea corporis: Secondary | ICD-10-CM

## 2022-11-06 ENCOUNTER — Encounter: Payer: Self-pay | Admitting: Family Medicine

## 2022-11-06 IMAGING — DX DG CHEST 2V
2 series · 2 of 2 positions shown · non-contrast
Comparison: None.

CLINICAL DATA: Shortness of breath, cough.

EXAM:
CHEST - 2 VIEW

[chest pa]
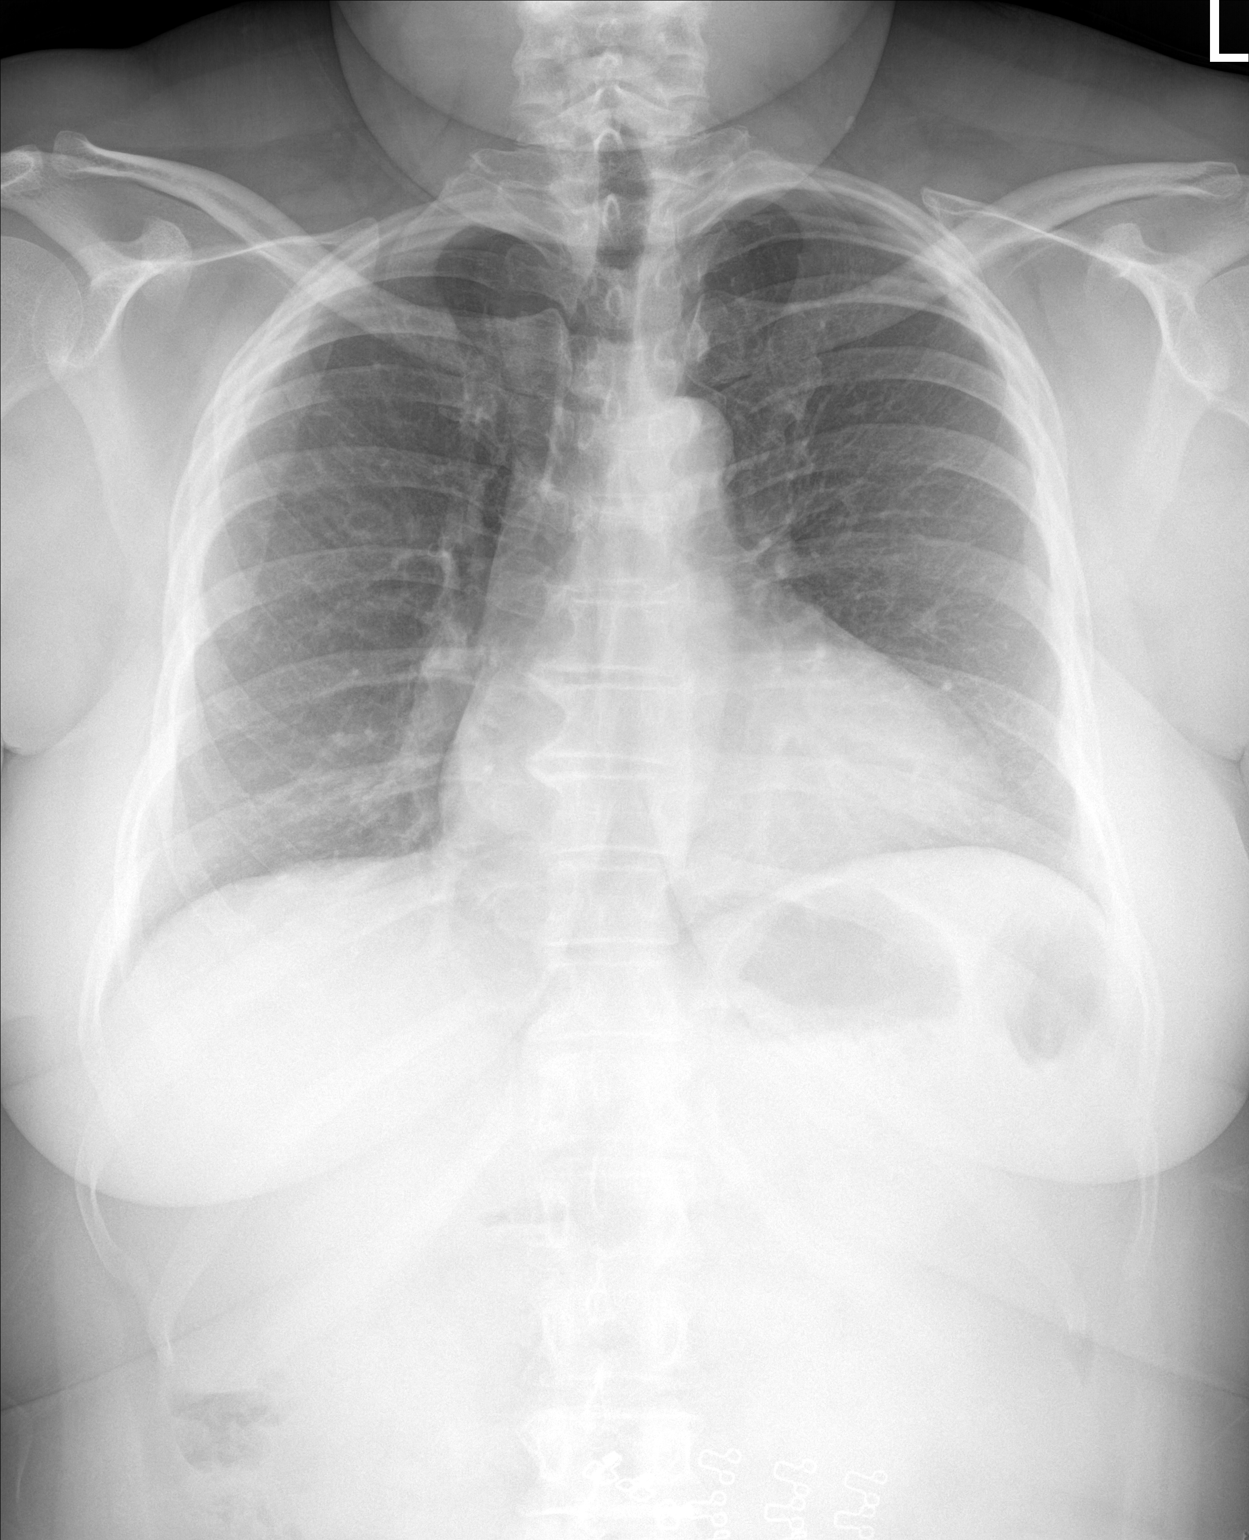

[chest lat]
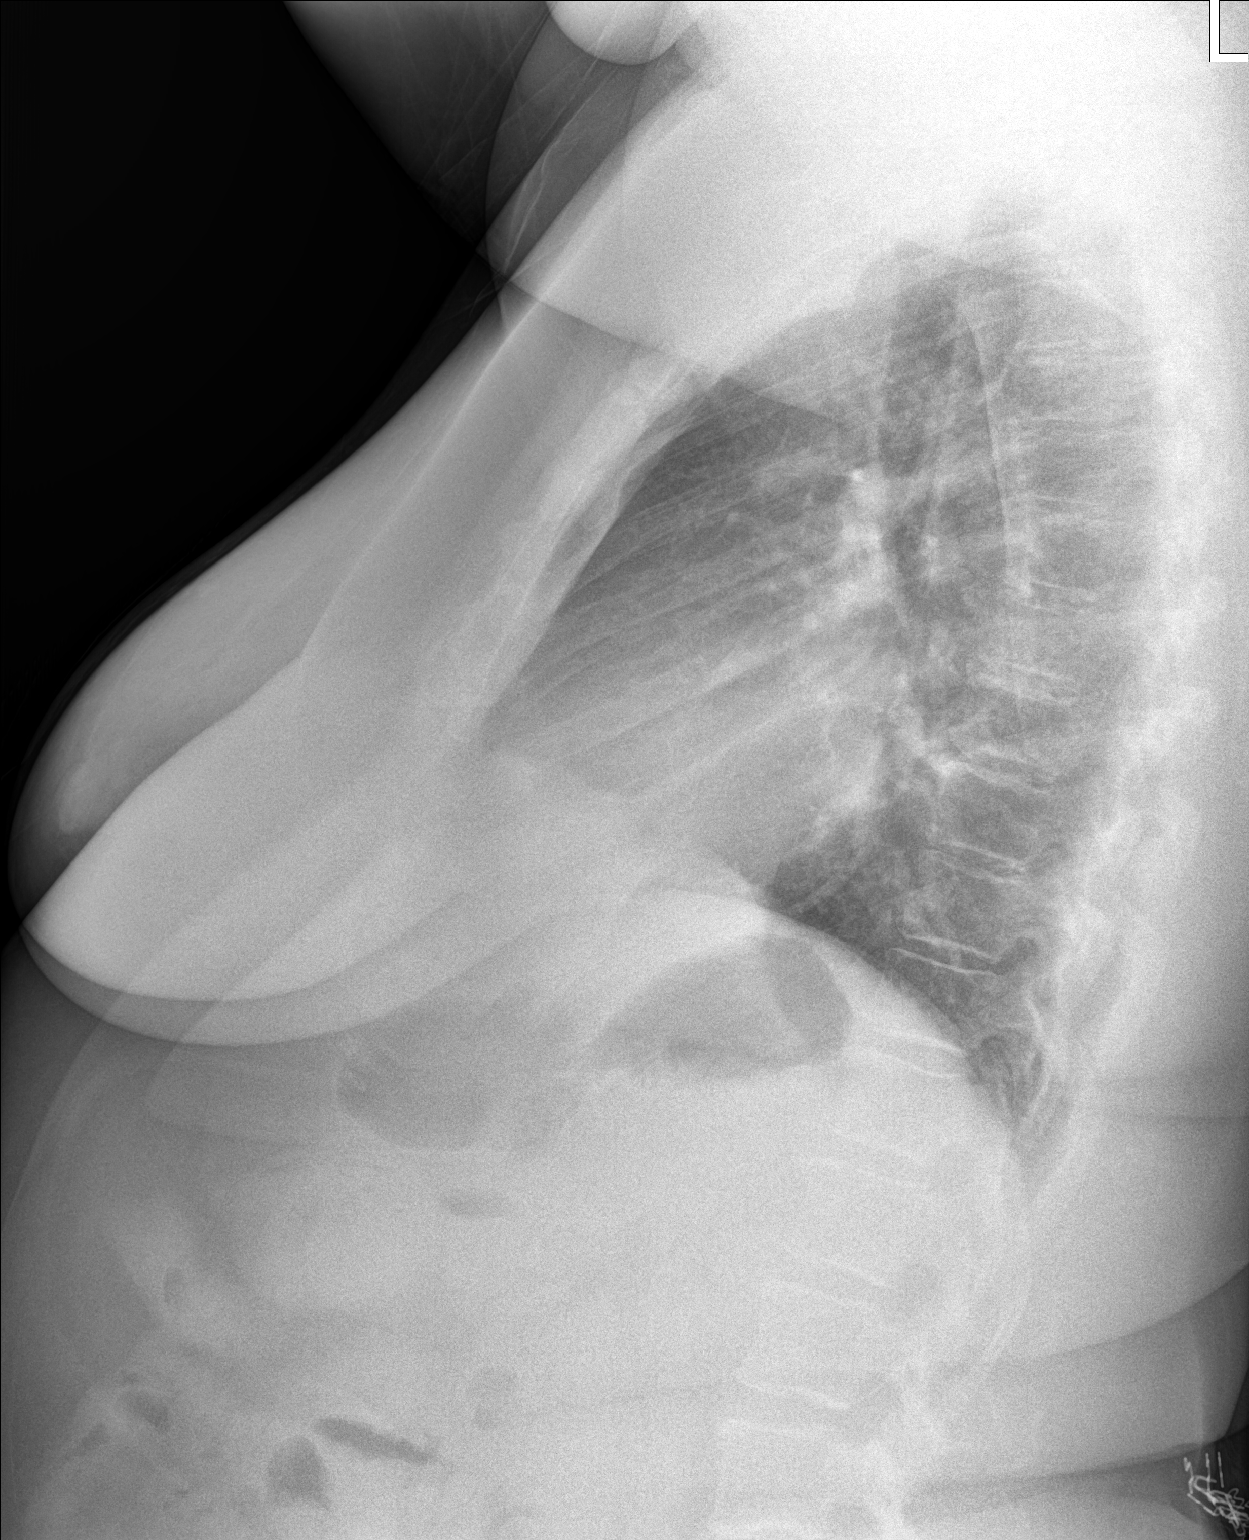

[2 of 2 positions shown; findings below may reference images not displayed]

FINDINGS: The heart size and mediastinal contours are within normal limits.
Both lungs are clear. No pneumothorax or pleural effusion is noted.
The visualized skeletal structures are unremarkable.
IMPRESSION: No active cardiopulmonary disease.

## 2022-11-07 ENCOUNTER — Ambulatory Visit: Payer: Self-pay | Admitting: Family Medicine

## 2022-11-08 ENCOUNTER — Encounter: Payer: Self-pay | Admitting: Family Medicine

## 2022-11-20 DIAGNOSIS — L299 Pruritus, unspecified: Secondary | ICD-10-CM | POA: Diagnosis not present

## 2022-11-20 DIAGNOSIS — E119 Type 2 diabetes mellitus without complications: Secondary | ICD-10-CM | POA: Diagnosis not present

## 2022-11-20 DIAGNOSIS — Z7985 Long-term (current) use of injectable non-insulin antidiabetic drugs: Secondary | ICD-10-CM | POA: Diagnosis not present

## 2022-11-20 DIAGNOSIS — R21 Rash and other nonspecific skin eruption: Secondary | ICD-10-CM | POA: Diagnosis not present

## 2022-11-20 DIAGNOSIS — I10 Essential (primary) hypertension: Secondary | ICD-10-CM | POA: Diagnosis not present

## 2022-12-03 DIAGNOSIS — R21 Rash and other nonspecific skin eruption: Secondary | ICD-10-CM | POA: Diagnosis not present

## 2023-08-23 ENCOUNTER — Other Ambulatory Visit: Payer: Self-pay

## 2023-08-23 ENCOUNTER — Encounter (HOSPITAL_COMMUNITY): Payer: Self-pay

## 2023-08-23 ENCOUNTER — Emergency Department (HOSPITAL_COMMUNITY)
Admission: EM | Admit: 2023-08-23 | Discharge: 2023-08-23 | Disposition: A | Discharge status: Home / Self Care | Payer: Self-pay | Attending: Student | Admitting: Student

## 2023-08-23 ENCOUNTER — Emergency Department (HOSPITAL_COMMUNITY): Payer: Self-pay

## 2023-08-23 DIAGNOSIS — M79672 Pain in left foot: Secondary | ICD-10-CM

## 2023-08-23 DIAGNOSIS — L84 Corns and callosities: Secondary | ICD-10-CM

## 2023-08-23 DIAGNOSIS — E119 Type 2 diabetes mellitus without complications: Secondary | ICD-10-CM | POA: Insufficient documentation

## 2023-08-23 DIAGNOSIS — Z79899 Other long term (current) drug therapy: Secondary | ICD-10-CM | POA: Insufficient documentation

## 2023-08-23 DIAGNOSIS — I10 Essential (primary) hypertension: Secondary | ICD-10-CM | POA: Insufficient documentation

## 2023-08-23 HISTORY — DX: Type 2 diabetes mellitus without complications: E11.9

## 2023-08-23 MED ORDER — KETOROLAC TROMETHAMINE 15 MG/ML IJ SOLN
15.0000 mg | Freq: Once | INTRAMUSCULAR | Status: AC
  Administered 2023-08-23: 15 mg via INTRAMUSCULAR
  Filled 2023-08-23: qty 1

## 2023-08-23 NOTE — ED Provider Notes (Signed)
 [place] [facility] AT [facility] Provider Note   CSN: [id number] Arrival date & time: 08/23/23  1040     Patient presents with: Foot Pain   Rebecca Garner is a 48 y.o. female.  She has PMH of diabetes, high cholesterol, sleep apnea, obesity.  Presents ER for several months of left foot pain.  She has a spot on the lateral aspect of the left heel that has a callus and states is very painful.  She states she has trouble tolerating wearing shoes due to the pressure and trouble standing for long.  She states she occasionally has burning in bilateral heels as well but primarily has the numbness about the left foot is the reason she came in today.  She has been taking over-the-counter Tylenol and ibuprofen without improvement.  She denies any injury to the foot, denies fever or chills.  She does report intermittent swelling of the left foot.    Foot Pain       Prior to Admission medications   Medication Sig Start Date End Date Taking? Authorizing Provider  albuterol  (VENTOLIN  HFA) 108 (90 Base) MCG/ACT inhaler Inhale 2 puffs into the lungs every 6 (six) hours as needed for wheezing or shortness of breath. 04/29/20   Zorita Hiss, FNP  atenolol  (TENORMIN ) 25 MG tablet Take 1 tablet (25 mg total) by mouth daily. 08/06/22   Albertha Huger, FNP  atorvastatin  (LIPITOR) 80 MG tablet Take 1 tablet (80 mg total) by mouth daily. 08/06/22   Albertha Huger, FNP  Blood Glucose Monitoring Suppl DEVI 1 each by Does not apply route in the morning, at noon, and at bedtime. May substitute to any manufacturer covered by patient's insurance. 04/19/22   Albertha Huger, FNP  busPIRone  (BUSPAR ) 10 MG tablet Take 1 tablet (10 mg total) by mouth 2 (two) times daily. 04/19/22   Albertha Huger, FNP  Continuous Blood Gluc Sensor (FREESTYLE LIBRE 3 SENSOR) MISC Place 1 sensor on the skin every 14 days. Use to check glucose continuously 06/06/22   Albertha Huger, FNP  Dulaglutide   (TRULICITY ) 1.5 MG/0.5ML SOPN Inject 1.5 mg into the skin once a week. DX; E11.9 08/30/22   Albertha Huger, FNP  escitalopram  (LEXAPRO ) 10 MG tablet Take 1 tablet (10 mg total) by mouth daily. 04/19/22   Albertha Huger, FNP  fluticasone  (FLONASE ) 50 MCG/ACT nasal spray Place 2 sprays into both nostrils daily. 12/04/21   Ijaola, Onyeje M, NP  hydrOXYzine  (ATARAX ) 10 MG tablet Take 1 tablet (10 mg total) by mouth 3 (three) times daily as needed for anxiety. 11/10/21   Albertha Huger, FNP  ketoconazole  (NIZORAL ) 2 % shampoo Apply 1 Application topically 2 (two) times a week. 10/29/22   St Annice Kim, NP  meloxicam  (MOBIC ) 15 MG tablet Take 1 tablet (15 mg total) by mouth daily. 06/15/21   Zorita Hiss, FNP  miconazole  (MICOTIN) 2 % cream Apply 1 Application topically 2 (two) times daily. 10/29/22   St Annice Kim, NP  olmesartan  (BENICAR ) 20 MG tablet Take 1 tablet (20 mg total) by mouth daily. 04/19/22   Albertha Huger, FNP  tolterodine  (DETROL ) 2 MG tablet Take 1 tablet (2 mg total) by mouth 2 (two) times daily. 04/19/22   Albertha Huger, FNP    Allergies: Metformin  and related    Review of Systems  Updated Vital Signs BP (!) 194/109 (BP Location: Left Arm) Comment: has not taken BP meds today  Pulse 82   Temp 98 F (36.7 C) (Oral)   Resp 16   Wt 102.1 kg   SpO2 97%   BMI 37.44 kg/m   Physical Exam Vitals and nursing note reviewed.  Constitutional:      General: She is not in acute distress.    Appearance: She is well-developed.  HENT:     Head: Normocephalic and atraumatic.   Eyes:     Conjunctiva/sclera: Conjunctivae normal.    Cardiovascular:     Rate and Rhythm: Normal rate and regular rhythm.     Heart sounds: No murmur heard. Pulmonary:     Effort: Pulmonary effort is normal. No respiratory distress.     Breath sounds: Normal breath sounds.  Abdominal:     Palpations: Abdomen is soft.     Tenderness: There is no abdominal tenderness.    Musculoskeletal:        General: No swelling.     Cervical back: Neck supple.     Comments: Raised area with overlying callus formation to lateral aspect left foot proximal to the fifth metatarsal head.  There is tenderness on palpation.  No surrounding erythema or warmth.  DP and PT pulses in left foot are intact.  Capillary fill is brisk in the toes.   Skin:    General: Skin is warm and dry.     Capillary Refill: Capillary refill takes less than 2 seconds.   Neurological:     General: No focal deficit present.     Mental Status: She is alert and oriented to person, place, and time.   Psychiatric:        Mood and Affect: Mood normal.     (all labs ordered are listed, but only abnormal results are displayed) Labs Reviewed - No data to display  EKG: None  Radiology: No results found.   Procedures   Medications Ordered in the ED  ketorolac (TORADOL) 15 MG/ML injection 15 mg (15 mg Intramuscular Given 08/23/23 1120)                                    Medical Decision Making Differential diagnosis includes but limited to corn, callus, fracture, sprain, bone spur, plantar fasciitis other  ED course: Patient having several months of pain and swelling to lateral aspect of left foot.  There is an area that looks like a corn clinically on exam is tender to palpation.  Will obtain x-rays to rule out any underlying bony abnormalities.  Clinically there is no sign of infection.  If x-ray is normal plan to have her do warm soaks, pad the area and follow-up with podiatry.  We can also provide her with a Velcro shoe which may put less pressure on her foot in the meantime.  She also does have pain in bilateral plantar aspects of the feet.  This is worse in the morning and with prolonged standing.  Suspect she has element of plantar fasciitis.  Advised on stretches and podiatry follow-up.  She is agreeable plan of care and discharge.  Pt noted to have very high blood pressure today. They  have no symptoms, including no chest pain, SOB, vision change, numbness/tingling/weakness.  This is likely secondary to her not taking her medications today.  She is informed to take her medication at home.  They were made aware of the value and the need to follow up with primary care in 24-48 hours,  and to return immediately if they develop any symptoms.      Amount and/or Complexity of Data Reviewed Radiology: ordered and independent interpretation performed.    Details: No fracture or other acute bony abnormality, or is a plantar heel spur.  Radiology read is delayed, discussed with patient we will call her if there are any changes.  Risk Prescription drug management.        Final diagnoses:  None    ED Discharge Orders     None          Joshua Nieves 08/23/23 1450    Kommor, White, MD 08/23/23 2145

## 2023-08-23 NOTE — Discharge Instructions (Addendum)
 Please follow up with the podiatrist. You can do warm soaks to help soften the callous, OTC corn pads can be used to pad the area as well. Avoid shoes that rub your feet. Please follow up with the foot doctor. You were given information for podiatrists in [place] and [place], [place].   Regarding the pain in the bottoms of both of your feet, you likely have plantar fasciitis.  Your x-ray showed a heel spur on the left side which can irritate the tendon on the bottom of your foot.  Wear supportive shoes and stretch her feet as directed.  Again follow-up with the foot doctor would be important for this.  You can continue taking over-the-counter Tylenol ibuprofen as needed for discomfort.  Your blood pressure was very high today, likely due to not taking your blood pressure medication at home.  Make sure you take it when you get home and follow-up with your primary care doctor for recheck

## 2023-08-23 NOTE — ED Triage Notes (Signed)
 Pt reports both of her feet have painful areas and burning in both heels.  Pt reports it is difficult to walk and stand from the pain.

## 2024-01-11 IMAGING — US US EXTREM LOW VENOUS*R*
1 series · 14 of 24 positions shown · non-contrast
Comparison: None Available.

CLINICAL DATA: RIGHT leg pain and swelling in a 46-year-old female.

EXAM:
RIGHT LOWER EXTREMITY VENOUS DOPPLER ULTRASOUND
TECHNIQUE: Gray-scale sonography with compression, as well as color and duplex
ultrasound, were performed to evaluate the deep venous system(s)
from the level of the common femoral vein through the popliteal and
proximal calf veins.

[Series 1: us venous img lower uni right (dvt) · portal-venous · 14 of 40 slices shown]
[im 1/40]
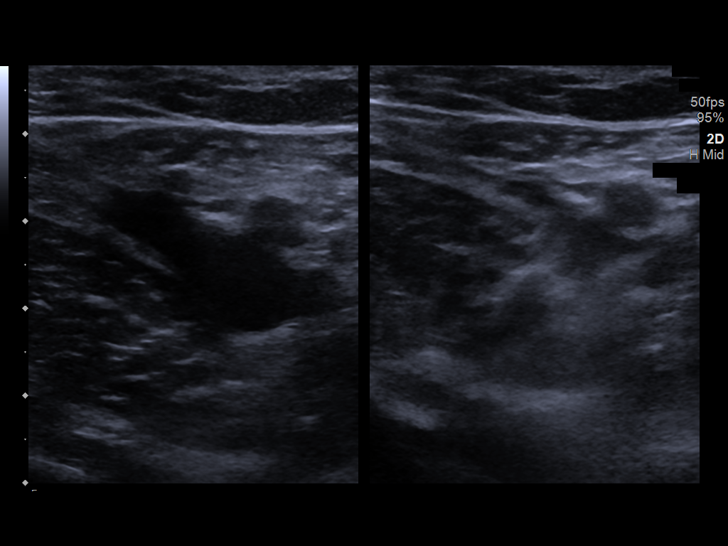
[im 4/40]
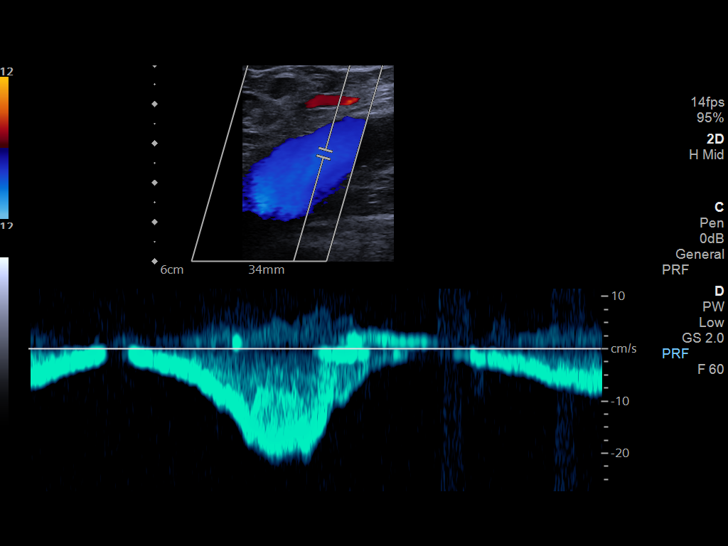
[im 7/40]
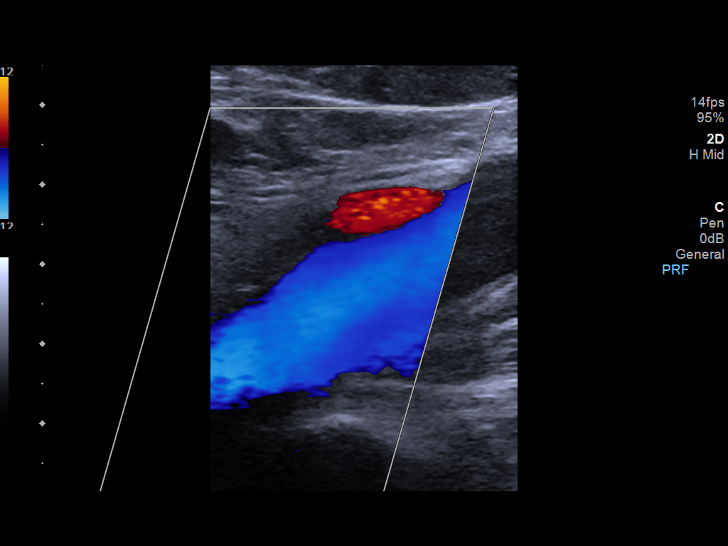
[im 11/40]
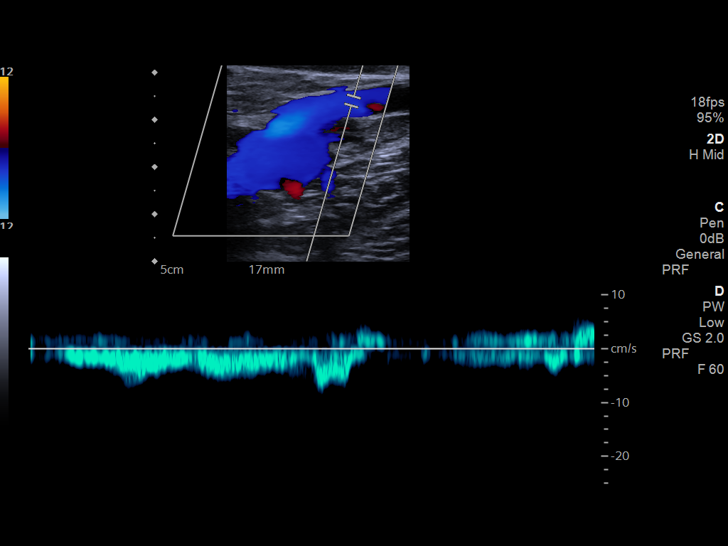
[im 12/40]
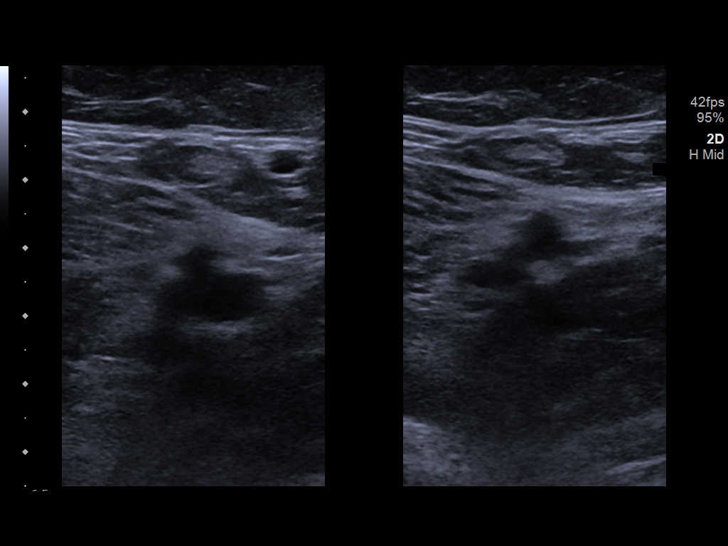
[im 16/40]
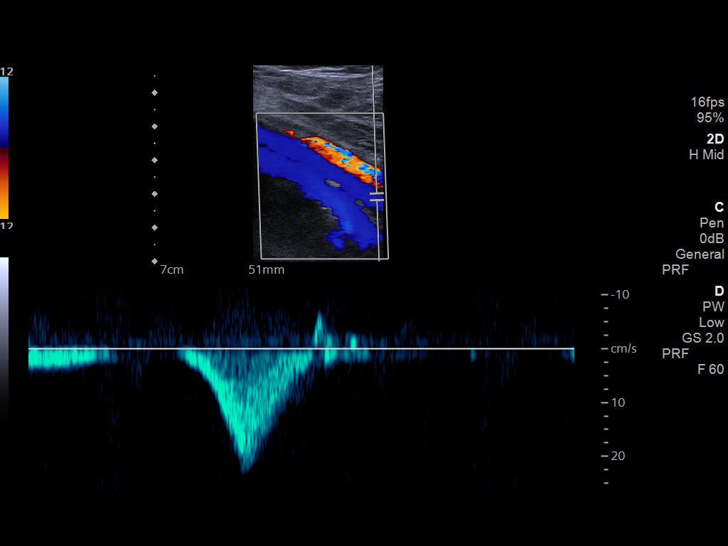
[im 19/40]
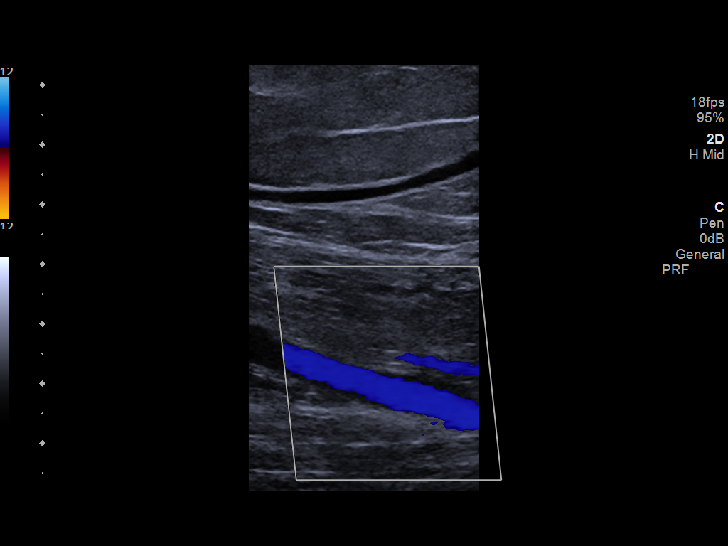
[im 21/40]
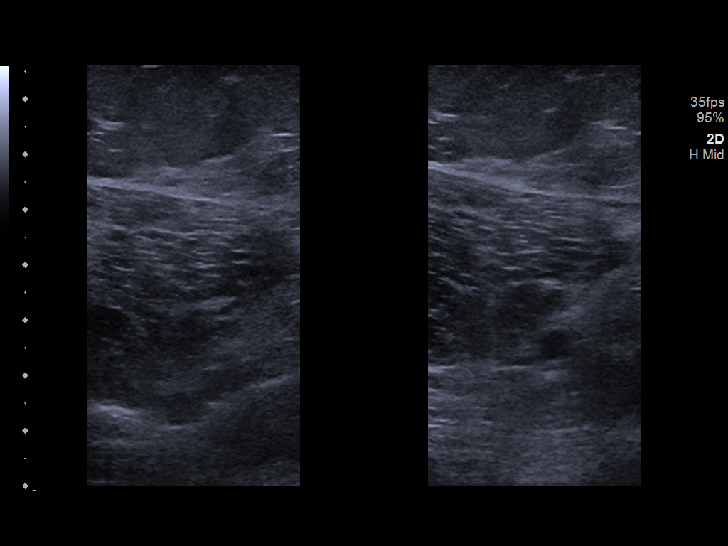
[im 24/40]
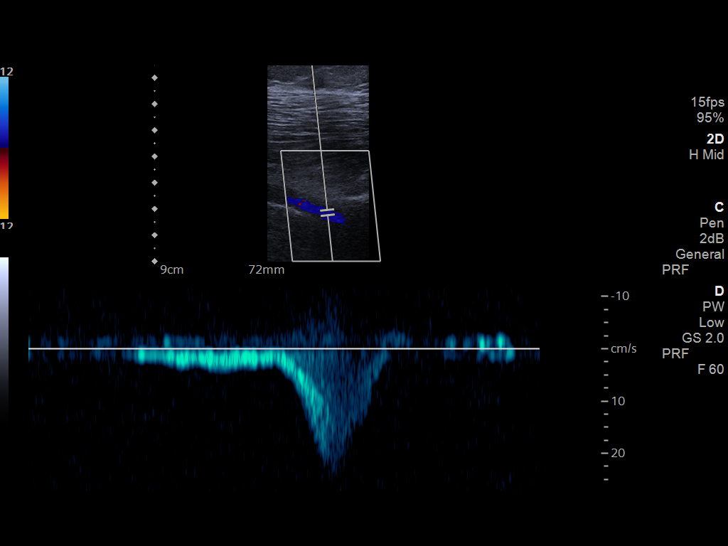
[im 28/40]
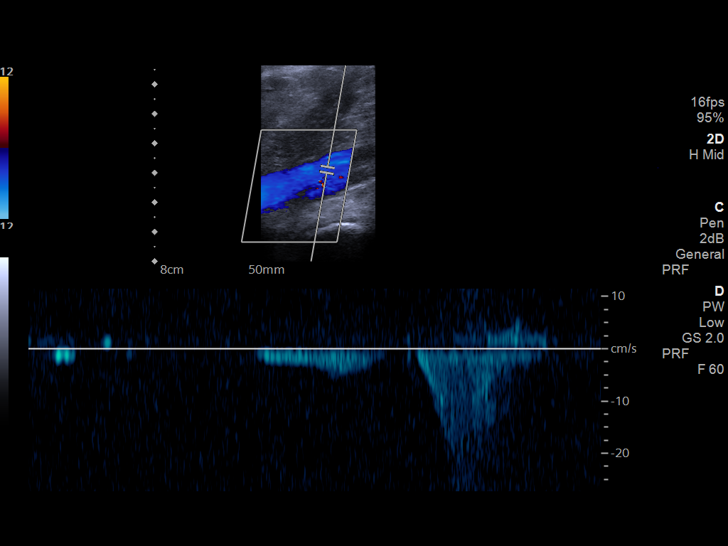
[im 31/40]
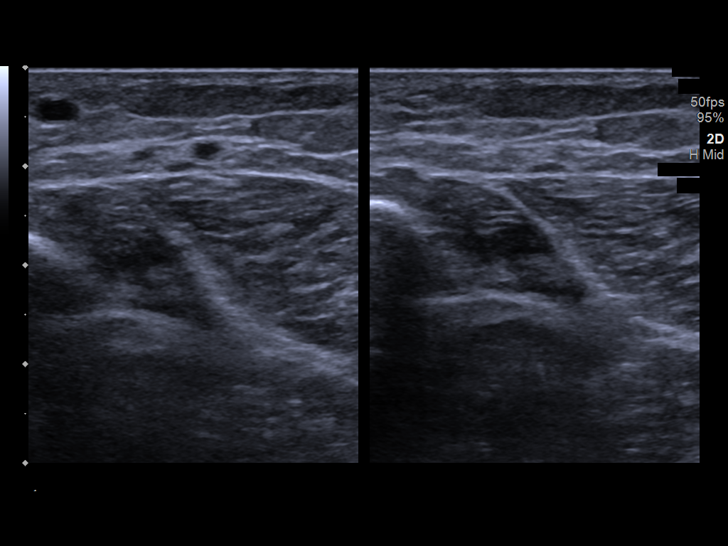
[im 33/40]
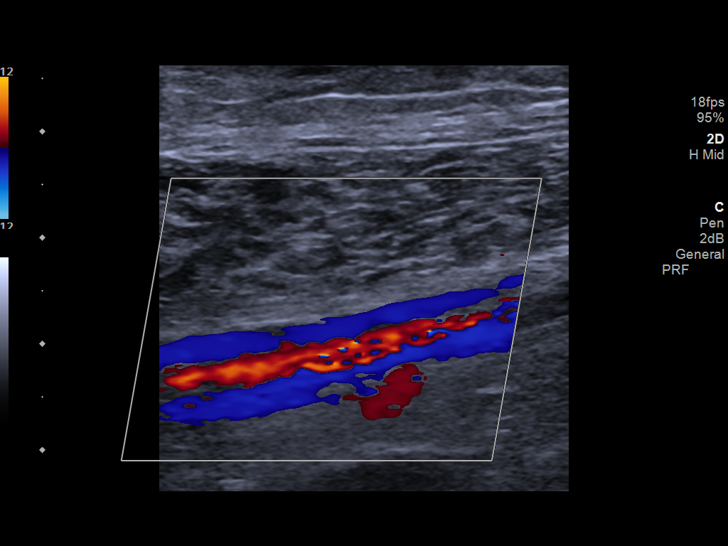
[im 36/40]
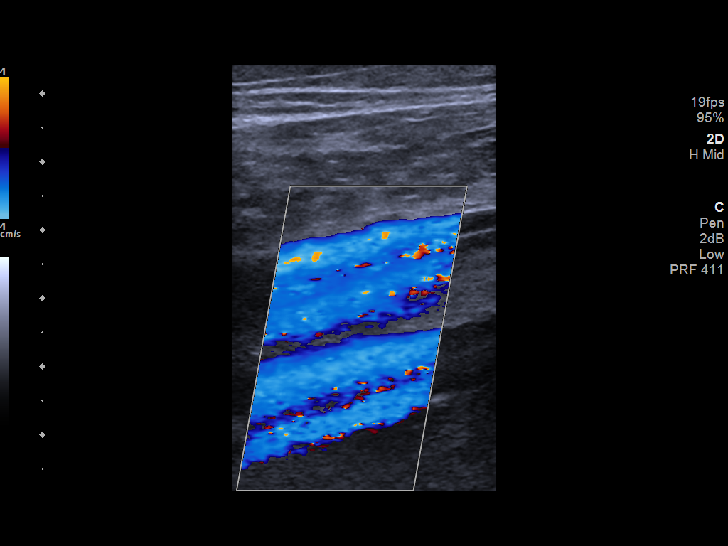
[im 40/40]
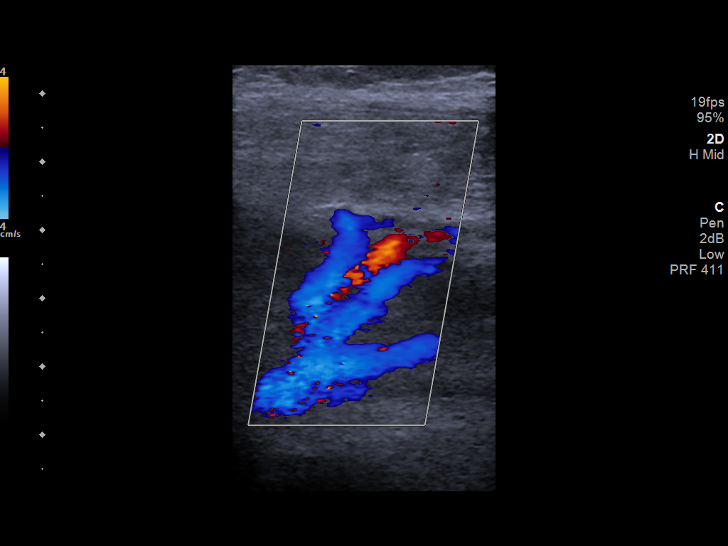

[14 of 24 positions shown; findings below may reference images not displayed]

FINDINGS: VENOUS

Normal compressibility of the common femoral, superficial femoral,
and popliteal veins, as well as the visualized calf veins.
Visualized portions of profunda femoral vein and great saphenous
vein unremarkable. No filling defects to suggest DVT on grayscale or
color Doppler imaging. Doppler waveforms show normal direction of
venous flow, normal respiratory plasticity and response to
augmentation.

Limited views of the contralateral common femoral vein are
unremarkable.

OTHER

None.

Limitations: none
IMPRESSION: Negative for deep venous thrombosis in the RIGHT lower extremity.
